# Patient Record
Sex: Female | Born: 1940 | Race: White | Hispanic: No | Smoking: Never smoker
Health system: Southern US, Community
[De-identification: ages and names within clinical notes are randomized; demographics above are authoritative.]

## PROBLEM LIST (undated history)

## (undated) DIAGNOSIS — I10 Essential (primary) hypertension: Secondary | ICD-10-CM

## (undated) DIAGNOSIS — H269 Unspecified cataract: Secondary | ICD-10-CM

## (undated) DIAGNOSIS — E7800 Pure hypercholesterolemia, unspecified: Secondary | ICD-10-CM

## (undated) DIAGNOSIS — F329 Major depressive disorder, single episode, unspecified: Secondary | ICD-10-CM

## (undated) DIAGNOSIS — C4490 Unspecified malignant neoplasm of skin, unspecified: Secondary | ICD-10-CM

## (undated) DIAGNOSIS — F32A Depression, unspecified: Secondary | ICD-10-CM

## (undated) DIAGNOSIS — J45909 Unspecified asthma, uncomplicated: Secondary | ICD-10-CM

## (undated) DIAGNOSIS — H409 Unspecified glaucoma: Secondary | ICD-10-CM

## (undated) HISTORY — PX: SKIN CANCER EXCISION: SHX779

---

## 1998-11-24 ENCOUNTER — Other Ambulatory Visit: Admission: RE | Admit: 1998-11-24 | Discharge: 1998-11-24 | Payer: Self-pay | Admitting: *Deleted

## 2000-04-10 ENCOUNTER — Emergency Department (HOSPITAL_COMMUNITY): Admission: EM | Admit: 2000-04-10 | Discharge: 2000-04-10 | Payer: Self-pay | Admitting: Emergency Medicine

## 2000-04-10 ENCOUNTER — Encounter: Payer: Self-pay | Admitting: Emergency Medicine

## 2002-06-22 ENCOUNTER — Encounter: Payer: Self-pay | Admitting: Family Medicine

## 2002-06-22 ENCOUNTER — Ambulatory Visit (HOSPITAL_COMMUNITY): Admission: RE | Admit: 2002-06-22 | Discharge: 2002-06-22 | Payer: Self-pay | Admitting: Family Medicine

## 2004-03-31 ENCOUNTER — Ambulatory Visit (HOSPITAL_COMMUNITY): Admission: RE | Admit: 2004-03-31 | Discharge: 2004-03-31 | Payer: Self-pay | Admitting: Family Medicine

## 2004-04-08 ENCOUNTER — Other Ambulatory Visit: Admission: RE | Admit: 2004-04-08 | Discharge: 2004-04-08 | Payer: Self-pay | Admitting: Family Medicine

## 2004-05-28 ENCOUNTER — Emergency Department (HOSPITAL_COMMUNITY): Admission: EM | Admit: 2004-05-28 | Discharge: 2004-05-28 | Payer: Self-pay | Admitting: Emergency Medicine

## 2006-04-12 ENCOUNTER — Other Ambulatory Visit: Admission: RE | Admit: 2006-04-12 | Discharge: 2006-04-12 | Payer: Self-pay | Admitting: Family Medicine

## 2008-04-10 ENCOUNTER — Other Ambulatory Visit: Admission: RE | Admit: 2008-04-10 | Discharge: 2008-04-10 | Payer: Self-pay | Admitting: Family Medicine

## 2008-05-13 ENCOUNTER — Ambulatory Visit: Payer: Self-pay | Admitting: Vascular Surgery

## 2010-06-16 NOTE — Procedures (Signed)
LOWER EXTREMITY VENOUS REFLUX EXAM   INDICATION:  Bilateral leg varicose veins with pain.   EXAM:  Using color-flow imaging and pulse Doppler spectral analysis, the  right and left common femoral, superficial femoral, popliteal, posterior  tibial, greater and lesser saphenous veins are evaluated.  There is no  evidence suggesting deep venous insufficiency in the right and left  lower extremity.   The right and left saphenofemoral junction is competent.  The right and  left GSV is competent with the caliber as described below.   The right and left proximal short saphenous vein demonstrates  competency.   GSV Diameter (used if found to be incompetent only)                                            Right    Left  Proximal Greater Saphenous Vein           cm       cm  Proximal-to-mid-thigh                     cm       cm  Mid thigh                                 cm       cm  Mid-distal thigh                          cm       cm  Distal thigh                              cm       cm  Knee                                      cm       cm   IMPRESSION:  1. The right and left greater saphenous vein reflux showed no evidence      of reflux.  2. The right and left greater saphenous vein is not aneurysmal.  3. The right and left greater saphenous vein is not tortuous.  4. The deep venous system is competent.  5. The right and left lesser saphenous veins are competent.  6. No evidence of deep venous thrombosis noted in the leg.   ___________________________________________  Quita Skye. Hart Rochester, M.D.   MG/MEDQ  D:  05/13/2008  T:  05/13/2008  Job:  [id number]

## 2010-06-16 NOTE — Consult Note (Signed)
NEW PATIENT CONSULTATION   Isabel Preston, Isabel Preston  DOB:  October 04, 1940                                       05/13/2008  VFIEP#:[id number]   The patient is a 70 year old female referred for venous insufficiency of  both lower extremities.  She has been having aching, throbbing  discomfort in both thigh and posterior calf areas for the last 2 years  which has worsened.  She has noticed some worsening patterns of venous  insufficiency in these areas.  She has no history of deep vein  thrombosis, thrombophlebitis, pulmonary emboli or clotting problems.  She has had no bleeding, stasis ulcers or distal edema.  The discomfort  is equal on the right and left sides.  She does take Advil, which helps  the symptoms, and it is improved by walking.   PAST MEDICAL HISTORY:  1. Hypertension.  2. Negative for diabetes, coronary artery disease, hyperlipidemia,      COPD or stroke.   PAST SURGICAL HISTORY:  None.   FAMILY HISTORY:  Positive for congestive heart failure in her mother,  diabetes in a sister, negative for stroke.   SOCIAL HISTORY:  She is married, works as a housewife, does not use  tobacco or alcohol.   REVIEW OF SYSTEMS:  Unremarkable with the exception of the lower  extremity discomfort.  Denies any chest pain, dyspnea on exertion, PND,  orthopnea, anorexia, weight loss, etc.   ALLERGIES:  Sulfa and iodine.   MEDICATIONS:  Please see health history form.   PHYSICAL EXAMINATION:  Blood pressure 150/70, heart rate 70,  respirations 14.  General:  She is a healthy-appearing middle-aged  female in no apparent distress, alert and oriented x3.  Neck:  Supple,  3+ carotid pulses palpable.  No bruits are audible.  Neurologic:  Normal.  No palpable adenopathy in the neck.  Chest:  Clear to  auscultation.  Cardiovascular:  Regular rhythm, no murmurs.  Abdomen:  Soft, nontender with no masses.  She has 3+ femoral, popliteal and 2+  dorsalis pedis pulses bilaterally.   There is no hyperpigmentation,  ulceration or evidence of large varicosities.  She has multiple patches  of spider and reticular veins, particularly in the right pretibial area  laterally between the knee and ankle.  On the left leg she has extensive  spider veins along the lateral thigh extending over to the medial thigh.  There is no ulceration involving these.   Venous duplex exam reveals normal deep system and no reflux in the  superficial system including the small and great saphenous veins.   I reassured her that her saphenous systems were okay.  If she desires  treatment, I think the best treatment would be primary sclerotherapy for  these patches of spider veins and reticular veins.  She will consider  this and if she would like to proceed she will be in touch with [place].  I  did tell her that we could not be certain that is causing her discomfort  and that following treatment with sclerotherapy her symptoms may or may  not be relieved.   Quita Skye Hart Rochester, M.D.  Electronically Signed   JDL/MEDQ  D:  05/13/2008  T:  05/14/2008  Job:  [id number]   cc:   Katrina Stack, Dr.

## 2011-09-02 ENCOUNTER — Ambulatory Visit (HOSPITAL_BASED_OUTPATIENT_CLINIC_OR_DEPARTMENT_OTHER)
Admission: RE | Admit: 2011-09-02 | Discharge: 2011-09-02 | Disposition: A | Discharge status: Home / Self Care | Payer: Medicare Other | Source: Ambulatory Visit | Attending: Family Medicine | Admitting: Family Medicine

## 2011-09-02 ENCOUNTER — Other Ambulatory Visit (HOSPITAL_BASED_OUTPATIENT_CLINIC_OR_DEPARTMENT_OTHER): Payer: Self-pay | Admitting: Family Medicine

## 2011-09-02 DIAGNOSIS — M5137 Other intervertebral disc degeneration, lumbosacral region: Secondary | ICD-10-CM | POA: Insufficient documentation

## 2011-09-02 DIAGNOSIS — M51379 Other intervertebral disc degeneration, lumbosacral region without mention of lumbar back pain or lower extremity pain: Secondary | ICD-10-CM | POA: Insufficient documentation

## 2011-09-02 DIAGNOSIS — R109 Unspecified abdominal pain: Secondary | ICD-10-CM

## 2011-09-02 DIAGNOSIS — J9819 Other pulmonary collapse: Secondary | ICD-10-CM | POA: Insufficient documentation

## 2011-09-02 DIAGNOSIS — K449 Diaphragmatic hernia without obstruction or gangrene: Secondary | ICD-10-CM | POA: Insufficient documentation

## 2011-09-09 ENCOUNTER — Other Ambulatory Visit (HOSPITAL_BASED_OUTPATIENT_CLINIC_OR_DEPARTMENT_OTHER): Payer: Self-pay | Admitting: Family Medicine

## 2011-09-09 DIAGNOSIS — R599 Enlarged lymph nodes, unspecified: Secondary | ICD-10-CM

## 2011-09-13 ENCOUNTER — Other Ambulatory Visit (HOSPITAL_BASED_OUTPATIENT_CLINIC_OR_DEPARTMENT_OTHER): Payer: Medicare Other

## 2011-09-15 ENCOUNTER — Ambulatory Visit (HOSPITAL_BASED_OUTPATIENT_CLINIC_OR_DEPARTMENT_OTHER)
Admission: RE | Admit: 2011-09-15 | Discharge: 2011-09-15 | Disposition: A | Discharge status: Home / Self Care | Payer: Medicare Other | Source: Ambulatory Visit | Attending: Family Medicine | Admitting: Family Medicine

## 2011-09-15 DIAGNOSIS — R918 Other nonspecific abnormal finding of lung field: Secondary | ICD-10-CM | POA: Insufficient documentation

## 2011-09-15 DIAGNOSIS — R599 Enlarged lymph nodes, unspecified: Secondary | ICD-10-CM

## 2011-09-20 ENCOUNTER — Other Ambulatory Visit: Payer: Self-pay | Admitting: Gastroenterology

## 2011-09-20 ENCOUNTER — Other Ambulatory Visit (HOSPITAL_COMMUNITY)
Admission: RE | Admit: 2011-09-20 | Discharge: 2011-09-20 | Disposition: A | Discharge status: Home / Self Care | Payer: Medicare Other | Source: Ambulatory Visit | Attending: Gastroenterology | Admitting: Gastroenterology

## 2011-09-20 DIAGNOSIS — B379 Candidiasis, unspecified: Secondary | ICD-10-CM | POA: Insufficient documentation

## 2012-06-14 ENCOUNTER — Other Ambulatory Visit (HOSPITAL_COMMUNITY): Payer: Self-pay | Admitting: Family Medicine

## 2012-06-14 DIAGNOSIS — Z780 Asymptomatic menopausal state: Secondary | ICD-10-CM

## 2012-06-14 DIAGNOSIS — Z1231 Encounter for screening mammogram for malignant neoplasm of breast: Secondary | ICD-10-CM

## 2012-06-28 ENCOUNTER — Ambulatory Visit (HOSPITAL_COMMUNITY): Payer: Medicare Other

## 2012-06-30 ENCOUNTER — Ambulatory Visit (HOSPITAL_COMMUNITY)
Admission: RE | Admit: 2012-06-30 | Discharge: 2012-06-30 | Disposition: A | Discharge status: Home / Self Care | Payer: Medicare Other | Source: Ambulatory Visit | Attending: Family Medicine | Admitting: Family Medicine

## 2012-06-30 DIAGNOSIS — Z1231 Encounter for screening mammogram for malignant neoplasm of breast: Secondary | ICD-10-CM

## 2012-06-30 DIAGNOSIS — Z780 Asymptomatic menopausal state: Secondary | ICD-10-CM | POA: Insufficient documentation

## 2012-06-30 DIAGNOSIS — Z13820 Encounter for screening for osteoporosis: Secondary | ICD-10-CM | POA: Insufficient documentation

## 2013-05-24 ENCOUNTER — Other Ambulatory Visit: Payer: Self-pay | Admitting: Family Medicine

## 2013-05-24 DIAGNOSIS — R1013 Epigastric pain: Secondary | ICD-10-CM

## 2013-05-31 ENCOUNTER — Other Ambulatory Visit: Payer: Medicare Other

## 2014-04-05 ENCOUNTER — Other Ambulatory Visit (HOSPITAL_COMMUNITY): Payer: Self-pay | Admitting: Family Medicine

## 2014-04-05 DIAGNOSIS — Z1231 Encounter for screening mammogram for malignant neoplasm of breast: Secondary | ICD-10-CM

## 2014-04-30 ENCOUNTER — Ambulatory Visit (HOSPITAL_COMMUNITY): Payer: Self-pay

## 2014-05-01 ENCOUNTER — Ambulatory Visit (HOSPITAL_COMMUNITY): Payer: Self-pay

## 2014-05-16 ENCOUNTER — Ambulatory Visit (HOSPITAL_COMMUNITY): Payer: Self-pay

## 2014-07-19 NOTE — Patient Instructions (Signed)
Isabel Preston  07/19/2014        Your procedure is scheduled on 8:20  Report to [facility] at 8:20 A.M.  Call this number if you have problems the morning of surgery:  [phone]   Remember:  Do not eat food or drink liquids after midnight.  Take these medicines the morning of surgery with A SIP OF WATER Advair and Singulair   Do not wear jewelry, make-up or nail polish.  Do not wear lotions, powders, or perfumes.  You may wear deodorant.  Do not shave 48 hours prior to surgery.  Men may shave face and neck.  Do not bring valuables to the hospital.  [facility] is not responsible for any belongings or valuables.  Contacts, dentures or bridgework may not be worn into surgery.  Leave your suitcase in the car.  After surgery it may be brought to your room.  For patients admitted to the hospital, discharge time will be determined by your treatment team.  Patients discharged the day of surgery will not be allowed to drive home.    Please read over the following fact sheets that you were given. Anesthesia Post-op Instructions      PATIENT INSTRUCTIONS POST-ANESTHESIA  IMMEDIATELY FOLLOWING SURGERY:  Do not drive or operate machinery for the first twenty four hours after surgery.  Do not make any important decisions for twenty four hours after surgery or while taking narcotic pain medications or sedatives.  If you develop intractable nausea and vomiting or a severe headache please notify your doctor immediately.  FOLLOW-UP:  Please make an appointment with your surgeon as instructed. You do not need to follow up with anesthesia unless specifically instructed to do so.  WOUND CARE INSTRUCTIONS (if applicable):  Keep a dry clean dressing on the anesthesia/puncture wound site if there is drainage.  Once the wound has quit draining you may leave it open to air.  Generally you should leave the bandage intact for twenty four hours unless there is drainage.  If the  epidural site drains for more than 36-48 hours please call the anesthesia department.  QUESTIONS?:  Please feel free to call your physician or the hospital operator if you have any questions, and they will be happy to assist you.      Cataract A cataract is a clouding of the lens of the eye. When a lens becomes cloudy, vision is reduced based on the degree and nature of the clouding. Many cataracts reduce vision to some degree. Some cataracts make people more near-sighted as they develop. Other cataracts increase glare. Cataracts that are ignored and become worse can sometimes look white. The white color can be seen through the pupil. CAUSES   Aging. However, cataracts may occur at any age, even in newborns.  Certain drugs.  Trauma to the eye.  Certain diseases such as diabetes.  Specific eye diseases such as chronic inflammation inside the eye or a sudden attack of a rare form of glaucoma.  Inherited or acquired medical problems. SYMPTOMS   Gradual, progressive drop in vision in the affected eye.  Severe, rapid visual loss. This most often happens when trauma is the cause. DIAGNOSIS  To detect a cataract, an eye doctor examines the lens. Cataracts are best diagnosed with an exam of the eyes with the pupils enlarged (dilated) by drops.  TREATMENT  For an early cataract, vision may improve by using different eyeglasses or stronger lighting. If that does not help your  vision, surgery is the only effective treatment. A cataract needs to be surgically removed when vision loss interferes with your everyday activities, such as driving, reading, or watching TV. A cataract may also have to be removed if it prevents examination or treatment of another eye problem. Surgery removes the cloudy lens and usually replaces it with a substitute lens (intraocular lens, IOL).  At a time when both you and your doctor agree, the cataract will be surgically removed. If you have cataracts in both eyes, only  one is usually removed at a time. This allows the operated eye to heal and be out of danger from any possible problems after surgery (such as infection or poor wound healing). In rare cases, a cataract may be doing damage to your eye. In these cases, your caregiver may advise surgical removal right away. The vast majority of people who have cataract surgery have better vision afterward. HOME CARE INSTRUCTIONS  If you are not planning surgery, you may be asked to do the following:  Use different eyeglasses.  Use stronger or brighter lighting.  Ask your eye doctor about reducing your medicine dose or changing medicines if it is thought that a medicine caused your cataract. Changing medicines does not make the cataract go away on its own.  Become familiar with your surroundings. Poor vision can lead to injury. Avoid bumping into things on the affected side. You are at a higher risk for tripping or falling.  Exercise extreme care when driving or operating machinery.  Wear sunglasses if you are sensitive to bright light or experiencing problems with glare. SEEK IMMEDIATE MEDICAL CARE IF:   You have a worsening or sudden vision loss.  You notice redness, swelling, or increasing pain in the eye.  You have a fever. Document Released: 01/18/2005 Document Revised: 04/12/2011 Document Reviewed: 09/11/2010 [facility] Patient Information 2015 [place], [place]. This information is not intended to replace advice given to you by your health care provider. Make sure you discuss any questions you have with your health care provider.

## 2014-07-22 ENCOUNTER — Other Ambulatory Visit: Payer: Self-pay

## 2014-07-22 ENCOUNTER — Encounter (HOSPITAL_COMMUNITY): Payer: Self-pay

## 2014-07-22 ENCOUNTER — Encounter (HOSPITAL_COMMUNITY)
Admission: RE | Admit: 2014-07-22 | Discharge: 2014-07-22 | Disposition: A | Discharge status: Home / Self Care | Payer: Medicare Other | Source: Ambulatory Visit | Attending: Ophthalmology | Admitting: Ophthalmology

## 2014-07-22 DIAGNOSIS — H269 Unspecified cataract: Secondary | ICD-10-CM | POA: Diagnosis present

## 2014-07-22 DIAGNOSIS — J45909 Unspecified asthma, uncomplicated: Secondary | ICD-10-CM | POA: Diagnosis not present

## 2014-07-22 DIAGNOSIS — Z85828 Personal history of other malignant neoplasm of skin: Secondary | ICD-10-CM | POA: Diagnosis not present

## 2014-07-22 DIAGNOSIS — H2512 Age-related nuclear cataract, left eye: Secondary | ICD-10-CM | POA: Diagnosis not present

## 2014-07-22 DIAGNOSIS — I10 Essential (primary) hypertension: Secondary | ICD-10-CM | POA: Diagnosis not present

## 2014-07-22 DIAGNOSIS — Z79899 Other long term (current) drug therapy: Secondary | ICD-10-CM | POA: Diagnosis not present

## 2014-07-22 HISTORY — DX: Unspecified malignant neoplasm of skin, unspecified: C44.90

## 2014-07-22 HISTORY — DX: Unspecified glaucoma: H40.9

## 2014-07-22 HISTORY — DX: Unspecified cataract: H26.9

## 2014-07-22 HISTORY — DX: Major depressive disorder, single episode, unspecified: F32.9

## 2014-07-22 HISTORY — DX: Unspecified asthma, uncomplicated: J45.909

## 2014-07-22 HISTORY — DX: Essential (primary) hypertension: I10

## 2014-07-22 HISTORY — DX: Pure hypercholesterolemia, unspecified: E78.00

## 2014-07-22 HISTORY — DX: Depression, unspecified: F32.A

## 2014-07-22 LAB — BASIC METABOLIC PANEL
Anion gap: 6 (ref 5–15)
BUN: 13 mg/dL (ref 6–20)
CALCIUM: 9.3 mg/dL (ref 8.9–10.3)
CO2: 31 mmol/L (ref 22–32)
CREATININE: 0.77 mg/dL (ref 0.44–1.00)
Chloride: 90 mmol/L — ABNORMAL LOW (ref 101–111)
Glucose, Bld: 98 mg/dL (ref 65–99)
POTASSIUM: 4 mmol/L (ref 3.5–5.1)
Sodium: 127 mmol/L — ABNORMAL LOW (ref 135–145)

## 2014-07-22 LAB — CBC
HEMATOCRIT: 33 % — AB (ref 36.0–46.0)
HEMOGLOBIN: 11 g/dL — AB (ref 12.0–15.0)
MCH: 29.6 pg (ref 26.0–34.0)
MCHC: 33.3 g/dL (ref 30.0–36.0)
MCV: 88.9 fL (ref 78.0–100.0)
Platelets: 371 10*3/uL (ref 150–400)
RBC: 3.71 MIL/uL — AB (ref 3.87–5.11)
RDW: 13.9 % (ref 11.5–15.5)
WBC: 8.8 10*3/uL (ref 4.0–10.5)

## 2014-07-24 MED ORDER — PHENYLEPHRINE HCL 2.5 % OP SOLN
OPHTHALMIC | Status: AC
  Filled 2014-07-24: qty 15

## 2014-07-24 MED ORDER — TETRACAINE HCL 0.5 % OP SOLN
OPHTHALMIC | Status: AC
  Filled 2014-07-24: qty 2

## 2014-07-24 MED ORDER — LIDOCAINE HCL (PF) 1 % IJ SOLN
INTRAMUSCULAR | Status: AC
  Filled 2014-07-24: qty 2

## 2014-07-24 MED ORDER — LIDOCAINE HCL 3.5 % OP GEL
OPHTHALMIC | Status: AC
  Filled 2014-07-24: qty 1

## 2014-07-24 MED ORDER — NEOMYCIN-POLYMYXIN-DEXAMETH 3.5-10000-0.1 OP SUSP
OPHTHALMIC | Status: AC
  Filled 2014-07-24: qty 5

## 2014-07-24 MED ORDER — CYCLOPENTOLATE-PHENYLEPHRINE OP SOLN OPTIME - NO CHARGE
OPHTHALMIC | Status: AC
  Filled 2014-07-24: qty 2

## 2014-07-25 ENCOUNTER — Ambulatory Visit (HOSPITAL_COMMUNITY): Payer: Medicare Other | Admitting: Anesthesiology

## 2014-07-25 ENCOUNTER — Encounter (HOSPITAL_COMMUNITY)
Admission: RE | Disposition: A | Discharge status: Home / Self Care | Payer: Self-pay | Source: Ambulatory Visit | Attending: Ophthalmology

## 2014-07-25 ENCOUNTER — Ambulatory Visit (HOSPITAL_COMMUNITY)
Admission: RE | Admit: 2014-07-25 | Discharge: 2014-07-25 | Disposition: A | Discharge status: Home / Self Care | Payer: Medicare Other | Source: Ambulatory Visit | Attending: Ophthalmology | Admitting: Ophthalmology

## 2014-07-25 ENCOUNTER — Encounter (HOSPITAL_COMMUNITY): Payer: Self-pay | Admitting: *Deleted

## 2014-07-25 DIAGNOSIS — J45909 Unspecified asthma, uncomplicated: Secondary | ICD-10-CM | POA: Insufficient documentation

## 2014-07-25 DIAGNOSIS — I10 Essential (primary) hypertension: Secondary | ICD-10-CM | POA: Diagnosis not present

## 2014-07-25 DIAGNOSIS — Z85828 Personal history of other malignant neoplasm of skin: Secondary | ICD-10-CM | POA: Insufficient documentation

## 2014-07-25 DIAGNOSIS — Z79899 Other long term (current) drug therapy: Secondary | ICD-10-CM | POA: Insufficient documentation

## 2014-07-25 DIAGNOSIS — H2512 Age-related nuclear cataract, left eye: Secondary | ICD-10-CM | POA: Diagnosis not present

## 2014-07-25 HISTORY — PX: CATARACT EXTRACTION W/PHACO: SHX586

## 2014-07-25 SURGERY — PHACOEMULSIFICATION, CATARACT, WITH IOL INSERTION
Anesthesia: Monitor Anesthesia Care | Site: Eye | Laterality: Left

## 2014-07-25 MED ORDER — BSS IO SOLN
INTRAOCULAR | Status: DC | PRN
  Administered 2014-07-25: 15 mL via INTRAOCULAR

## 2014-07-25 MED ORDER — CYCLOPENTOLATE-PHENYLEPHRINE 0.2-1 % OP SOLN
1.0000 [drp] | OPHTHALMIC | Status: AC
  Administered 2014-07-25 (×3): 1 [drp] via OPHTHALMIC

## 2014-07-25 MED ORDER — FENTANYL CITRATE (PF) 100 MCG/2ML IJ SOLN
25.0000 ug | INTRAMUSCULAR | Status: AC
  Administered 2014-07-25 (×2): 25 ug via INTRAVENOUS

## 2014-07-25 MED ORDER — LIDOCAINE 3.5 % OP GEL OPTIME - NO CHARGE
OPHTHALMIC | Status: DC | PRN
  Administered 2014-07-25: 1 [drp] via OPHTHALMIC

## 2014-07-25 MED ORDER — LACTATED RINGERS IV SOLN
INTRAVENOUS | Status: DC
  Administered 2014-07-25: 1000 mL via INTRAVENOUS

## 2014-07-25 MED ORDER — MIDAZOLAM HCL 2 MG/2ML IJ SOLN
INTRAMUSCULAR | Status: AC
  Filled 2014-07-25: qty 2

## 2014-07-25 MED ORDER — FENTANYL CITRATE (PF) 100 MCG/2ML IJ SOLN: 25.0000 ug | INTRAMUSCULAR | Status: DC | PRN

## 2014-07-25 MED ORDER — ONDANSETRON HCL 4 MG/2ML IJ SOLN: 4.0000 mg | Freq: Once | INTRAMUSCULAR | Status: DC | PRN

## 2014-07-25 MED ORDER — LIDOCAINE HCL 3.5 % OP GEL
1.0000 "application " | Freq: Once | OPHTHALMIC | Status: AC
  Administered 2014-07-25: 1 via OPHTHALMIC

## 2014-07-25 MED ORDER — NEOMYCIN-POLYMYXIN-DEXAMETH 3.5-10000-0.1 OP SUSP
OPHTHALMIC | Status: DC | PRN
  Administered 2014-07-25: 1 [drp] via OPHTHALMIC

## 2014-07-25 MED ORDER — PHENYLEPHRINE HCL 2.5 % OP SOLN
1.0000 [drp] | OPHTHALMIC | Status: AC
  Administered 2014-07-25 (×3): 1 [drp] via OPHTHALMIC

## 2014-07-25 MED ORDER — PROVISC 10 MG/ML IO SOLN
INTRAOCULAR | Status: DC | PRN
  Administered 2014-07-25: 0.85 mL via INTRAOCULAR

## 2014-07-25 MED ORDER — LIDOCAINE HCL (PF) 1 % IJ SOLN
INTRAMUSCULAR | Status: DC | PRN
  Administered 2014-07-25: .6 mL

## 2014-07-25 MED ORDER — TETRACAINE HCL 0.5 % OP SOLN
1.0000 [drp] | OPHTHALMIC | Status: AC
  Administered 2014-07-25 (×3): 1 [drp] via OPHTHALMIC

## 2014-07-25 MED ORDER — POVIDONE-IODINE 5 % OP SOLN
OPHTHALMIC | Status: DC | PRN
  Administered 2014-07-25: 1 via OPHTHALMIC

## 2014-07-25 MED ORDER — MIDAZOLAM HCL 2 MG/2ML IJ SOLN
1.0000 mg | INTRAMUSCULAR | Status: DC | PRN
  Administered 2014-07-25 (×2): 2 mg via INTRAVENOUS

## 2014-07-25 MED ORDER — EPINEPHRINE HCL 1 MG/ML IJ SOLN
INTRAOCULAR | Status: DC | PRN
  Administered 2014-07-25: 500 mL

## 2014-07-25 MED ORDER — FENTANYL CITRATE (PF) 100 MCG/2ML IJ SOLN
INTRAMUSCULAR | Status: AC
  Filled 2014-07-25: qty 2

## 2014-07-25 MED ORDER — MIDAZOLAM HCL 5 MG/5ML IJ SOLN
INTRAMUSCULAR | Status: AC
  Filled 2014-07-25: qty 5

## 2014-07-25 SURGICAL SUPPLY — 34 items
CAPSULAR TENSION RING-AMO (OPHTHALMIC RELATED) IMPLANT
CLOTH BEACON ORANGE TIMEOUT ST (SAFETY) ×2 IMPLANT
EYE SHIELD UNIVERSAL CLEAR (GAUZE/BANDAGES/DRESSINGS) ×2 IMPLANT
GLOVE BIO SURGEON STRL SZ 6.5 (GLOVE) IMPLANT
GLOVE BIO SURGEONS STRL SZ 6.5 (GLOVE)
GLOVE BIOGEL PI IND STRL 6.5 (GLOVE) IMPLANT
GLOVE BIOGEL PI IND STRL 7.0 (GLOVE) IMPLANT
GLOVE BIOGEL PI IND STRL 7.5 (GLOVE) IMPLANT
GLOVE BIOGEL PI INDICATOR 6.5 (GLOVE) ×2
GLOVE BIOGEL PI INDICATOR 7.0 (GLOVE)
GLOVE BIOGEL PI INDICATOR 7.5 (GLOVE)
GLOVE ECLIPSE 6.5 STRL STRAW (GLOVE) IMPLANT
GLOVE ECLIPSE 7.0 STRL STRAW (GLOVE) IMPLANT
GLOVE ECLIPSE 7.5 STRL STRAW (GLOVE) IMPLANT
GLOVE EXAM NITRILE LRG STRL (GLOVE) IMPLANT
GLOVE EXAM NITRILE MD LF STRL (GLOVE) ×2 IMPLANT
GLOVE SKINSENSE NS SZ6.5 (GLOVE)
GLOVE SKINSENSE NS SZ7.0 (GLOVE)
GLOVE SKINSENSE STRL SZ6.5 (GLOVE) IMPLANT
GLOVE SKINSENSE STRL SZ7.0 (GLOVE) IMPLANT
KIT VITRECTOMY (OPHTHALMIC RELATED) IMPLANT
PAD ARMBOARD 7.5X6 YLW CONV (MISCELLANEOUS) IMPLANT
PROC W NO LENS (INTRAOCULAR LENS)
PROC W SPEC LENS (INTRAOCULAR LENS)
PROCESS W NO LENS (INTRAOCULAR LENS) IMPLANT
PROCESS W SPEC LENS (INTRAOCULAR LENS) IMPLANT
RETRACTOR IRIS SIGHTPATH (OPHTHALMIC RELATED) IMPLANT
RING MALYGIN (MISCELLANEOUS) IMPLANT
SIGHTPATH CAT PROC W REG LENS (Ophthalmic Related) ×3 IMPLANT
SYRINGE LUER LOK 1CC (MISCELLANEOUS) ×2 IMPLANT
TAPE SURG TRANSPORE 1 IN (GAUZE/BANDAGES/DRESSINGS) IMPLANT
TAPE SURGICAL TRANSPORE 1 IN (GAUZE/BANDAGES/DRESSINGS) ×2
VISCOELASTIC ADDITIONAL (OPHTHALMIC RELATED) IMPLANT
WATER STERILE IRR 250ML POUR (IV SOLUTION) ×2 IMPLANT

## 2014-07-25 NOTE — Op Note (Signed)
Date of Admission: 07/25/2014  Date of Surgery: 07/25/2014   Pre-Op Dx: Cataract Left Eye  Post-Op Dx: Senile Nuclear Cataract Left  Eye,  Dx Code H25.12  Surgeon: Tonny Branch, M.D.  Assistants: None  Anesthesia: Topical with MAC  Indications: Painless, progressive loss of vision with compromise of daily activities.  Surgery: Cataract Extraction with Intraocular lens Implant Left Eye  Discription: The patient had dilating drops and viscous lidocaine placed into the Left eye in the pre-op holding area. After transfer to the operating room, a time out was performed. The patient was then prepped and draped. Beginning with a 68 degree blade a paracentesis port was made at the surgeon's 2 o'clock position. The anterior chamber was then filled with 1% non-preserved lidocaine. This was followed by filling the anterior chamber with Provisc.  A 2.31mm keratome blade was used to make a clear corneal incision at the temporal limbus.  A bent cystatome needle was used to create a continuous tear capsulotomy. Hydrodissection was performed with balanced salt solution on a Fine canula. The lens nucleus was then removed using the phacoemulsification handpiece. Residual cortex was removed with the I&A handpiece. The anterior chamber and capsular bag were refilled with Provisc. A posterior chamber intraocular lens was placed into the capsular bag with it's injector. The implant was positioned with the Kuglan hook. The Provisc was then removed from the anterior chamber and capsular bag with the I&A handpiece. Stromal hydration of the main incision and paracentesis port was performed with BSS on a Fine canula. The wounds were tested for leak which was negative. The patient tolerated the procedure well. There were no operative complications. The patient was then transferred to the recovery room in stable condition.  Complications: None  Specimen: None  EBL: None  Prosthetic device: Hoya iSert 250, power 21.5 D, SN  [id number].

## 2014-07-25 NOTE — Anesthesia Postprocedure Evaluation (Signed)
  Anesthesia Post-op Note  Patient: Isabel Preston  Procedure(s) Performed: Procedure(s) (LRB): CATARACT EXTRACTION PHACO AND INTRAOCULAR LENS PLACEMENT ; CDE:  5.83 (Left)  Patient Location:  Short Stay  Anesthesia Type: MAC  Level of Consciousness: awake  Airway and Oxygen Therapy: Patient Spontanous Breathing  Post-op Pain: none  Post-op Assessment: Post-op Vital signs reviewed, Patient's Cardiovascular Status Stable, Respiratory Function Stable, Patent Airway, No signs of Nausea or vomiting and Pain level controlled  Post-op Vital Signs: Reviewed and stable  Complications: No apparent anesthesia complications

## 2014-07-25 NOTE — Anesthesia Procedure Notes (Signed)
Procedure Name: MAC Date/Time: 07/25/2014 12:13 PM Performed by: Vista Deck Pre-anesthesia Checklist: Patient identified, Emergency Drugs available, Suction available, Timeout performed and Patient being monitored Patient Re-evaluated:Patient Re-evaluated prior to inductionOxygen Delivery Method: Nasal Cannula

## 2014-07-25 NOTE — Transfer of Care (Signed)
Immediate Anesthesia Transfer of Care Note  Patient: Isabel Preston  Procedure(s) Performed: Procedure(s) (LRB): CATARACT EXTRACTION PHACO AND INTRAOCULAR LENS PLACEMENT ; CDE:  5.83 (Left)  Patient Location: Shortstay  Anesthesia Type: MAC  Level of Consciousness: awake  Airway & Oxygen Therapy: Patient Spontanous Breathing   Post-op Assessment: Report given to PACU RN, Post -op Vital signs reviewed and stable and Patient moving all extremities  Post vital signs: Reviewed and stable  Complications: No apparent anesthesia complications

## 2014-07-25 NOTE — H&P (Signed)
I have reviewed the H&P, the patient was re-examined, and I have identified no interval changes in medical condition and plan of care since the history and physical of record  

## 2014-07-25 NOTE — Anesthesia Preprocedure Evaluation (Signed)
Anesthesia Evaluation  Patient identified by MRN, date of birth, ID band Patient awake    Reviewed: Allergy & Precautions, NPO status , Patient's Chart, lab work & pertinent test results  Airway Mallampati: II  TM Distance: >3 FB     Dental  (+) Edentulous Upper, Edentulous Lower   Pulmonary asthma ,  breath sounds clear to auscultation        Cardiovascular hypertension, Pt. on medications Rhythm:Regular Rate:Normal     Neuro/Psych PSYCHIATRIC DISORDERS Depression    GI/Hepatic negative GI ROS,   Endo/Other    Renal/GU      Musculoskeletal   Abdominal   Peds  Hematology   Anesthesia Other Findings   Reproductive/Obstetrics                             Anesthesia Physical Anesthesia Plan  ASA: II  Anesthesia Plan: MAC   Post-op Pain Management:    Induction: Intravenous  Airway Management Planned: Nasal Cannula  Additional Equipment:   Intra-op Plan:   Post-operative Plan:   Informed Consent: I have reviewed the patients History and Physical, chart, labs and discussed the procedure including the risks, benefits and alternatives for the proposed anesthesia with the patient or authorized representative who has indicated his/her understanding and acceptance.     Plan Discussed with:   Anesthesia Plan Comments:         Anesthesia Quick Evaluation

## 2014-07-25 NOTE — Discharge Instructions (Signed)

## 2014-07-29 ENCOUNTER — Encounter (HOSPITAL_COMMUNITY): Payer: Self-pay | Admitting: Ophthalmology

## 2014-08-07 ENCOUNTER — Encounter (HOSPITAL_COMMUNITY): Payer: Self-pay

## 2014-08-07 ENCOUNTER — Encounter (HOSPITAL_COMMUNITY)
Admission: RE | Admit: 2014-08-07 | Discharge: 2014-08-07 | Disposition: A | Discharge status: Home / Self Care | Payer: Medicare Other | Source: Ambulatory Visit | Attending: Ophthalmology | Admitting: Ophthalmology

## 2014-08-09 MED ORDER — PHENYLEPHRINE HCL 2.5 % OP SOLN
OPHTHALMIC | Status: AC
  Filled 2014-08-09: qty 15

## 2014-08-09 MED ORDER — LIDOCAINE HCL (PF) 1 % IJ SOLN
INTRAMUSCULAR | Status: AC
  Filled 2014-08-09: qty 2

## 2014-08-09 MED ORDER — TETRACAINE HCL 0.5 % OP SOLN
OPHTHALMIC | Status: AC
  Filled 2014-08-09: qty 2

## 2014-08-09 MED ORDER — CYCLOPENTOLATE-PHENYLEPHRINE OP SOLN OPTIME - NO CHARGE
OPHTHALMIC | Status: AC
  Filled 2014-08-09: qty 2

## 2014-08-09 MED ORDER — LIDOCAINE HCL 3.5 % OP GEL
OPHTHALMIC | Status: AC
  Filled 2014-08-09: qty 1

## 2014-08-09 MED ORDER — NEOMYCIN-POLYMYXIN-DEXAMETH 3.5-10000-0.1 OP SUSP
OPHTHALMIC | Status: AC
  Filled 2014-08-09: qty 5

## 2014-08-12 ENCOUNTER — Encounter (HOSPITAL_COMMUNITY)
Admission: RE | Disposition: A | Discharge status: Home / Self Care | Payer: Self-pay | Source: Ambulatory Visit | Attending: Ophthalmology

## 2014-08-12 ENCOUNTER — Ambulatory Visit (HOSPITAL_COMMUNITY)
Admission: RE | Admit: 2014-08-12 | Discharge: 2014-08-12 | Disposition: A | Discharge status: Home / Self Care | Payer: Medicare Other | Source: Ambulatory Visit | Attending: Ophthalmology | Admitting: Ophthalmology

## 2014-08-12 ENCOUNTER — Encounter (HOSPITAL_COMMUNITY): Payer: Self-pay | Admitting: *Deleted

## 2014-08-12 ENCOUNTER — Ambulatory Visit (HOSPITAL_COMMUNITY): Payer: Medicare Other | Admitting: Anesthesiology

## 2014-08-12 DIAGNOSIS — J45909 Unspecified asthma, uncomplicated: Secondary | ICD-10-CM | POA: Insufficient documentation

## 2014-08-12 DIAGNOSIS — H2511 Age-related nuclear cataract, right eye: Secondary | ICD-10-CM | POA: Insufficient documentation

## 2014-08-12 DIAGNOSIS — F329 Major depressive disorder, single episode, unspecified: Secondary | ICD-10-CM | POA: Insufficient documentation

## 2014-08-12 DIAGNOSIS — Z888 Allergy status to other drugs, medicaments and biological substances status: Secondary | ICD-10-CM | POA: Diagnosis not present

## 2014-08-12 DIAGNOSIS — I10 Essential (primary) hypertension: Secondary | ICD-10-CM | POA: Insufficient documentation

## 2014-08-12 DIAGNOSIS — Z882 Allergy status to sulfonamides status: Secondary | ICD-10-CM | POA: Diagnosis not present

## 2014-08-12 DIAGNOSIS — Z885 Allergy status to narcotic agent status: Secondary | ICD-10-CM | POA: Insufficient documentation

## 2014-08-12 HISTORY — PX: CATARACT EXTRACTION W/PHACO: SHX586

## 2014-08-12 SURGERY — PHACOEMULSIFICATION, CATARACT, WITH IOL INSERTION
Anesthesia: Monitor Anesthesia Care | Site: Eye | Laterality: Right

## 2014-08-12 MED ORDER — LIDOCAINE HCL 3.5 % OP GEL
1.0000 "application " | Freq: Once | OPHTHALMIC | Status: AC
  Administered 2014-08-12: 1 via OPHTHALMIC

## 2014-08-12 MED ORDER — CYCLOPENTOLATE-PHENYLEPHRINE 0.2-1 % OP SOLN
1.0000 [drp] | OPHTHALMIC | Status: AC
Start: 2014-08-12 — End: 2014-08-12
  Administered 2014-08-12 (×3): 1 [drp] via OPHTHALMIC

## 2014-08-12 MED ORDER — POVIDONE-IODINE 5 % OP SOLN
OPHTHALMIC | Status: DC | PRN
  Administered 2014-08-12: 1 via OPHTHALMIC

## 2014-08-12 MED ORDER — BSS IO SOLN
INTRAOCULAR | Status: DC | PRN
  Administered 2014-08-12: 15 mL

## 2014-08-12 MED ORDER — FENTANYL CITRATE (PF) 100 MCG/2ML IJ SOLN
25.0000 ug | INTRAMUSCULAR | Status: AC
  Administered 2014-08-12 (×2): 25 ug via INTRAVENOUS

## 2014-08-12 MED ORDER — EPINEPHRINE HCL 1 MG/ML IJ SOLN
INTRAOCULAR | Status: DC | PRN
  Administered 2014-08-12: 500 mL

## 2014-08-12 MED ORDER — MIDAZOLAM HCL 2 MG/2ML IJ SOLN
INTRAMUSCULAR | Status: AC
  Filled 2014-08-12: qty 2

## 2014-08-12 MED ORDER — LACTATED RINGERS IV SOLN
INTRAVENOUS | Status: DC
Start: 2014-08-12 — End: 2014-08-12
  Administered 2014-08-12: 10:00:00 via INTRAVENOUS

## 2014-08-12 MED ORDER — PROVISC 10 MG/ML IO SOLN
INTRAOCULAR | Status: DC | PRN
  Administered 2014-08-12: 0.85 mL via INTRAOCULAR

## 2014-08-12 MED ORDER — NEOMYCIN-POLYMYXIN-DEXAMETH 3.5-10000-0.1 OP SUSP
OPHTHALMIC | Status: DC | PRN
  Administered 2014-08-12: 2 [drp] via OPHTHALMIC

## 2014-08-12 MED ORDER — LIDOCAINE HCL (PF) 1 % IJ SOLN
INTRAMUSCULAR | Status: DC | PRN
  Administered 2014-08-12: .4 mL

## 2014-08-12 MED ORDER — TETRACAINE HCL 0.5 % OP SOLN
1.0000 [drp] | OPHTHALMIC | Status: AC
  Administered 2014-08-12 (×3): 1 [drp] via OPHTHALMIC

## 2014-08-12 MED ORDER — EPINEPHRINE HCL 1 MG/ML IJ SOLN
INTRAMUSCULAR | Status: AC
  Filled 2014-08-12: qty 1

## 2014-08-12 MED ORDER — FENTANYL CITRATE (PF) 100 MCG/2ML IJ SOLN
INTRAMUSCULAR | Status: AC
  Filled 2014-08-12: qty 2

## 2014-08-12 MED ORDER — PHENYLEPHRINE HCL 2.5 % OP SOLN
1.0000 [drp] | OPHTHALMIC | Status: AC
  Administered 2014-08-12 (×3): 1 [drp] via OPHTHALMIC

## 2014-08-12 MED ORDER — LIDOCAINE 3.5 % OP GEL OPTIME - NO CHARGE: OPHTHALMIC | Status: DC | PRN

## 2014-08-12 MED ORDER — MIDAZOLAM HCL 2 MG/2ML IJ SOLN
1.0000 mg | INTRAMUSCULAR | Status: DC | PRN
  Administered 2014-08-12: 2 mg via INTRAVENOUS

## 2014-08-12 SURGICAL SUPPLY — 10 items
CLOTH BEACON ORANGE TIMEOUT ST (SAFETY) ×2 IMPLANT
EYE SHIELD UNIVERSAL CLEAR (GAUZE/BANDAGES/DRESSINGS) ×2 IMPLANT
GLOVE BIOGEL PI IND STRL 7.0 (GLOVE) IMPLANT
GLOVE BIOGEL PI INDICATOR 7.0 (GLOVE) ×4
PAD ARMBOARD 7.5X6 YLW CONV (MISCELLANEOUS) ×2 IMPLANT
SIGHTPATH CAT PROC W REG LENS (Ophthalmic Related) ×3 IMPLANT
SYRINGE LUER LOK 1CC (MISCELLANEOUS) ×2 IMPLANT
TAPE SURG TRANSPORE 1 IN (GAUZE/BANDAGES/DRESSINGS) IMPLANT
TAPE SURGICAL TRANSPORE 1 IN (GAUZE/BANDAGES/DRESSINGS) ×2
WATER STERILE IRR 250ML POUR (IV SOLUTION) ×2 IMPLANT

## 2014-08-12 NOTE — Anesthesia Preprocedure Evaluation (Signed)
Anesthesia Evaluation  Patient identified by MRN, date of birth, ID band Patient awake    Reviewed: Allergy & Precautions, NPO status , Patient's Chart, lab work & pertinent test results  Airway Mallampati: II  TM Distance: >3 FB     Dental  (+) Edentulous Upper, Edentulous Lower   Pulmonary asthma ,  breath sounds clear to auscultation        Cardiovascular hypertension, Pt. on medications Rhythm:Regular Rate:Normal     Neuro/Psych PSYCHIATRIC DISORDERS Depression    GI/Hepatic negative GI ROS,   Endo/Other    Renal/GU      Musculoskeletal   Abdominal   Peds  Hematology   Anesthesia Other Findings   Reproductive/Obstetrics                             Anesthesia Physical Anesthesia Plan  ASA: II  Anesthesia Plan: MAC   Post-op Pain Management:    Induction: Intravenous  Airway Management Planned: Nasal Cannula  Additional Equipment:   Intra-op Plan:   Post-operative Plan:   Informed Consent: I have reviewed the patients History and Physical, chart, labs and discussed the procedure including the risks, benefits and alternatives for the proposed anesthesia with the patient or authorized representative who has indicated his/her understanding and acceptance.     Plan Discussed with:   Anesthesia Plan Comments:         Anesthesia Quick Evaluation

## 2014-08-12 NOTE — Progress Notes (Signed)
headache

## 2014-08-12 NOTE — Transfer of Care (Signed)
Immediate Anesthesia Transfer of Care Note  Patient: Isabel Preston  Procedure(s) Performed: Procedure(s) with comments: CATARACT EXTRACTION PHACO AND INTRAOCULAR LENS PLACEMENT RIGHT EYE (Right) - CDE:6.52  Patient Location: Short Stay  Anesthesia Type:MAC  Level of Consciousness: awake  Airway & Oxygen Therapy: Patient Spontanous Breathing  Post-op Assessment: Report given to RN  Post vital signs: Reviewed  Last Vitals:  Filed Vitals:   08/12/14 1040  BP: 146/72  Pulse:   Temp:   Resp: 16    Complications: No apparent anesthesia complications

## 2014-08-12 NOTE — Op Note (Signed)
Date of Admission: 08/12/2014  Date of Surgery: 08/12/2014   Pre-Op Dx: Cataract Right Eye  Post-Op Dx: Senile Nuclear Cataract Right  Eye,  Dx Code H25.11  Surgeon: Tonny Branch, M.D.  Assistants: None  Anesthesia: Topical with MAC  Indications: Painless, progressive loss of vision with compromise of daily activities.  Surgery: Cataract Extraction with Intraocular lens Implant Right Eye  Discription: The patient had dilating drops and viscous lidocaine placed into the Right eye in the pre-op holding area. After transfer to the operating room, a time out was performed. The patient was then prepped and draped. Beginning with a 70 degree blade a paracentesis port was made at the surgeon's 2 o'clock position. The anterior chamber was then filled with 1% non-preserved lidocaine. This was followed by filling the anterior chamber with Provisc.  A 2.53mm keratome blade was used to make a clear corneal incision at the temporal limbus.  A bent cystatome needle was used to create a continuous tear capsulotomy. Hydrodissection was performed with balanced salt solution on a Fine canula. The lens nucleus was then removed using the phacoemulsification handpiece. Residual cortex was removed with the I&A handpiece. The anterior chamber and capsular bag were refilled with Provisc. A posterior chamber intraocular lens was placed into the capsular bag with it's injector. The implant was positioned with the Kuglan hook. The Provisc was then removed from the anterior chamber and capsular bag with the I&A handpiece. Stromal hydration of the main incision and paracentesis port was performed with BSS on a Fine canula. The wounds were tested for leak which was negative. The patient tolerated the procedure well. There were no operative complications. The patient was then transferred to the recovery room in stable condition.  Complications: None  Specimen: None  EBL: None  Prosthetic device: Hoya iSert 250, power 21.5 D,  SN [id number].

## 2014-08-12 NOTE — H&P (Signed)
I have reviewed the H&P, the patient was re-examined, and I have identified no interval changes in medical condition and plan of care since the history and physical of record  

## 2014-08-12 NOTE — Discharge Instructions (Signed)

## 2014-08-12 NOTE — Anesthesia Postprocedure Evaluation (Signed)
  Anesthesia Post-op Note  Patient: Isabel Preston  Procedure(s) Performed: Procedure(s) with comments: CATARACT EXTRACTION PHACO AND INTRAOCULAR LENS PLACEMENT RIGHT EYE (Right) - CDE:6.52  Patient Location: Short Stay  Anesthesia Type:MAC  Level of Consciousness: awake, alert  and oriented  Airway and Oxygen Therapy: Patient Spontanous Breathing  Post-op Pain: none  Post-op Assessment: Post-op Vital signs reviewed, Patient's Cardiovascular Status Stable, Respiratory Function Stable, Patent Airway and No signs of Nausea or vomiting              Post-op Vital Signs: Reviewed and stable  Last Vitals:  Filed Vitals:   08/12/14 1040  BP: 146/72  Pulse:   Temp:   Resp: 16    Complications: No apparent anesthesia complications

## 2014-08-13 ENCOUNTER — Encounter (HOSPITAL_COMMUNITY): Payer: Self-pay | Admitting: Ophthalmology

## 2016-01-08 ENCOUNTER — Other Ambulatory Visit (HOSPITAL_COMMUNITY): Payer: Self-pay | Admitting: Family Medicine

## 2016-01-08 DIAGNOSIS — Z1231 Encounter for screening mammogram for malignant neoplasm of breast: Secondary | ICD-10-CM

## 2016-01-08 DIAGNOSIS — M810 Age-related osteoporosis without current pathological fracture: Secondary | ICD-10-CM

## 2016-01-08 DIAGNOSIS — Z780 Asymptomatic menopausal state: Secondary | ICD-10-CM

## 2016-02-04 ENCOUNTER — Ambulatory Visit (HOSPITAL_COMMUNITY): Payer: Medicare Other

## 2016-02-04 ENCOUNTER — Ambulatory Visit (HOSPITAL_COMMUNITY)
Admission: RE | Admit: 2016-02-04 | Discharge: 2016-02-04 | Disposition: A | Discharge status: Home / Self Care | Payer: Medicare Other | Source: Ambulatory Visit | Attending: Family Medicine | Admitting: Family Medicine

## 2016-02-04 ENCOUNTER — Other Ambulatory Visit (HOSPITAL_COMMUNITY): Payer: Medicare Other

## 2016-02-04 DIAGNOSIS — Z780 Asymptomatic menopausal state: Secondary | ICD-10-CM | POA: Diagnosis present

## 2016-02-04 DIAGNOSIS — Z1231 Encounter for screening mammogram for malignant neoplasm of breast: Secondary | ICD-10-CM | POA: Diagnosis not present

## 2016-02-04 DIAGNOSIS — M810 Age-related osteoporosis without current pathological fracture: Secondary | ICD-10-CM | POA: Diagnosis not present

## 2016-02-04 DIAGNOSIS — M85851 Other specified disorders of bone density and structure, right thigh: Secondary | ICD-10-CM | POA: Diagnosis not present

## 2016-02-04 DIAGNOSIS — M8588 Other specified disorders of bone density and structure, other site: Secondary | ICD-10-CM | POA: Diagnosis not present

## 2016-02-04 DIAGNOSIS — E559 Vitamin D deficiency, unspecified: Secondary | ICD-10-CM | POA: Insufficient documentation

## 2019-04-05 ENCOUNTER — Other Ambulatory Visit (HOSPITAL_COMMUNITY): Payer: Self-pay | Admitting: Family Medicine

## 2019-04-05 DIAGNOSIS — M810 Age-related osteoporosis without current pathological fracture: Secondary | ICD-10-CM

## 2019-04-05 DIAGNOSIS — Z1231 Encounter for screening mammogram for malignant neoplasm of breast: Secondary | ICD-10-CM

## 2019-05-02 ENCOUNTER — Ambulatory Visit (HOSPITAL_COMMUNITY)
Admission: RE | Admit: 2019-05-02 | Discharge: 2019-05-02 | Disposition: A | Discharge status: Home / Self Care | Payer: Medicare Other | Source: Ambulatory Visit | Attending: Family Medicine | Admitting: Family Medicine

## 2019-05-02 ENCOUNTER — Other Ambulatory Visit: Payer: Self-pay

## 2019-05-02 DIAGNOSIS — Z1231 Encounter for screening mammogram for malignant neoplasm of breast: Secondary | ICD-10-CM | POA: Insufficient documentation

## 2019-05-02 DIAGNOSIS — M810 Age-related osteoporosis without current pathological fracture: Secondary | ICD-10-CM | POA: Diagnosis not present

## 2020-03-03 DIAGNOSIS — I10 Essential (primary) hypertension: Secondary | ICD-10-CM | POA: Diagnosis not present

## 2020-03-03 DIAGNOSIS — E785 Hyperlipidemia, unspecified: Secondary | ICD-10-CM | POA: Diagnosis not present

## 2020-03-03 DIAGNOSIS — K219 Gastro-esophageal reflux disease without esophagitis: Secondary | ICD-10-CM | POA: Diagnosis not present

## 2020-03-03 DIAGNOSIS — J45901 Unspecified asthma with (acute) exacerbation: Secondary | ICD-10-CM | POA: Diagnosis not present

## 2020-03-03 DIAGNOSIS — M810 Age-related osteoporosis without current pathological fracture: Secondary | ICD-10-CM | POA: Diagnosis not present

## 2020-03-03 DIAGNOSIS — J45909 Unspecified asthma, uncomplicated: Secondary | ICD-10-CM | POA: Diagnosis not present

## 2020-04-07 DIAGNOSIS — Z79899 Other long term (current) drug therapy: Secondary | ICD-10-CM | POA: Diagnosis not present

## 2020-04-07 DIAGNOSIS — M810 Age-related osteoporosis without current pathological fracture: Secondary | ICD-10-CM | POA: Diagnosis not present

## 2020-04-07 DIAGNOSIS — Z0000 Encounter for general adult medical examination without abnormal findings: Secondary | ICD-10-CM | POA: Diagnosis not present

## 2020-04-07 DIAGNOSIS — I10 Essential (primary) hypertension: Secondary | ICD-10-CM | POA: Diagnosis not present

## 2020-04-07 DIAGNOSIS — E785 Hyperlipidemia, unspecified: Secondary | ICD-10-CM | POA: Diagnosis not present

## 2020-04-07 DIAGNOSIS — J45909 Unspecified asthma, uncomplicated: Secondary | ICD-10-CM | POA: Diagnosis not present

## 2020-04-14 DIAGNOSIS — L820 Inflamed seborrheic keratosis: Secondary | ICD-10-CM | POA: Diagnosis not present

## 2020-04-14 DIAGNOSIS — L570 Actinic keratosis: Secondary | ICD-10-CM | POA: Diagnosis not present

## 2020-04-14 DIAGNOSIS — C44629 Squamous cell carcinoma of skin of left upper limb, including shoulder: Secondary | ICD-10-CM | POA: Diagnosis not present

## 2020-04-14 DIAGNOSIS — L905 Scar conditions and fibrosis of skin: Secondary | ICD-10-CM | POA: Diagnosis not present

## 2020-04-14 DIAGNOSIS — Z85828 Personal history of other malignant neoplasm of skin: Secondary | ICD-10-CM | POA: Diagnosis not present

## 2020-04-14 DIAGNOSIS — C4402 Squamous cell carcinoma of skin of lip: Secondary | ICD-10-CM | POA: Diagnosis not present

## 2020-04-14 DIAGNOSIS — C44712 Basal cell carcinoma of skin of right lower limb, including hip: Secondary | ICD-10-CM | POA: Diagnosis not present

## 2020-04-14 DIAGNOSIS — C44719 Basal cell carcinoma of skin of left lower limb, including hip: Secondary | ICD-10-CM | POA: Diagnosis not present

## 2020-04-14 DIAGNOSIS — D485 Neoplasm of uncertain behavior of skin: Secondary | ICD-10-CM | POA: Diagnosis not present

## 2020-04-14 DIAGNOSIS — L814 Other melanin hyperpigmentation: Secondary | ICD-10-CM | POA: Diagnosis not present

## 2020-04-16 DIAGNOSIS — L814 Other melanin hyperpigmentation: Secondary | ICD-10-CM | POA: Diagnosis not present

## 2020-05-01 DIAGNOSIS — K219 Gastro-esophageal reflux disease without esophagitis: Secondary | ICD-10-CM | POA: Diagnosis not present

## 2020-05-01 DIAGNOSIS — J45909 Unspecified asthma, uncomplicated: Secondary | ICD-10-CM | POA: Diagnosis not present

## 2020-05-01 DIAGNOSIS — J45901 Unspecified asthma with (acute) exacerbation: Secondary | ICD-10-CM | POA: Diagnosis not present

## 2020-05-01 DIAGNOSIS — E785 Hyperlipidemia, unspecified: Secondary | ICD-10-CM | POA: Diagnosis not present

## 2020-05-01 DIAGNOSIS — I10 Essential (primary) hypertension: Secondary | ICD-10-CM | POA: Diagnosis not present

## 2020-05-01 DIAGNOSIS — M810 Age-related osteoporosis without current pathological fracture: Secondary | ICD-10-CM | POA: Diagnosis not present

## 2020-05-12 DIAGNOSIS — C44719 Basal cell carcinoma of skin of left lower limb, including hip: Secondary | ICD-10-CM | POA: Diagnosis not present

## 2020-05-12 DIAGNOSIS — C44629 Squamous cell carcinoma of skin of left upper limb, including shoulder: Secondary | ICD-10-CM | POA: Diagnosis not present

## 2020-05-27 DIAGNOSIS — C4402 Squamous cell carcinoma of skin of lip: Secondary | ICD-10-CM | POA: Diagnosis not present

## 2020-05-27 DIAGNOSIS — D0462 Carcinoma in situ of skin of left upper limb, including shoulder: Secondary | ICD-10-CM | POA: Diagnosis not present

## 2020-05-29 DIAGNOSIS — E785 Hyperlipidemia, unspecified: Secondary | ICD-10-CM | POA: Diagnosis not present

## 2020-05-29 DIAGNOSIS — K219 Gastro-esophageal reflux disease without esophagitis: Secondary | ICD-10-CM | POA: Diagnosis not present

## 2020-05-29 DIAGNOSIS — J45909 Unspecified asthma, uncomplicated: Secondary | ICD-10-CM | POA: Diagnosis not present

## 2020-05-29 DIAGNOSIS — M810 Age-related osteoporosis without current pathological fracture: Secondary | ICD-10-CM | POA: Diagnosis not present

## 2020-05-29 DIAGNOSIS — J45901 Unspecified asthma with (acute) exacerbation: Secondary | ICD-10-CM | POA: Diagnosis not present

## 2020-05-29 DIAGNOSIS — I10 Essential (primary) hypertension: Secondary | ICD-10-CM | POA: Diagnosis not present

## 2020-06-17 DIAGNOSIS — C44712 Basal cell carcinoma of skin of right lower limb, including hip: Secondary | ICD-10-CM | POA: Diagnosis not present

## 2020-06-17 DIAGNOSIS — D0462 Carcinoma in situ of skin of left upper limb, including shoulder: Secondary | ICD-10-CM | POA: Diagnosis not present

## 2020-06-24 DIAGNOSIS — H401132 Primary open-angle glaucoma, bilateral, moderate stage: Secondary | ICD-10-CM | POA: Diagnosis not present

## 2020-07-14 DIAGNOSIS — M810 Age-related osteoporosis without current pathological fracture: Secondary | ICD-10-CM | POA: Diagnosis not present

## 2020-07-14 DIAGNOSIS — E785 Hyperlipidemia, unspecified: Secondary | ICD-10-CM | POA: Diagnosis not present

## 2020-07-14 DIAGNOSIS — I10 Essential (primary) hypertension: Secondary | ICD-10-CM | POA: Diagnosis not present

## 2020-07-14 DIAGNOSIS — J45901 Unspecified asthma with (acute) exacerbation: Secondary | ICD-10-CM | POA: Diagnosis not present

## 2020-07-14 DIAGNOSIS — J45909 Unspecified asthma, uncomplicated: Secondary | ICD-10-CM | POA: Diagnosis not present

## 2020-07-14 DIAGNOSIS — K219 Gastro-esophageal reflux disease without esophagitis: Secondary | ICD-10-CM | POA: Diagnosis not present

## 2020-08-28 DIAGNOSIS — M7989 Other specified soft tissue disorders: Secondary | ICD-10-CM | POA: Diagnosis not present

## 2020-08-29 DIAGNOSIS — E785 Hyperlipidemia, unspecified: Secondary | ICD-10-CM | POA: Diagnosis not present

## 2020-08-29 DIAGNOSIS — I10 Essential (primary) hypertension: Secondary | ICD-10-CM | POA: Diagnosis not present

## 2020-08-29 DIAGNOSIS — M810 Age-related osteoporosis without current pathological fracture: Secondary | ICD-10-CM | POA: Diagnosis not present

## 2020-08-29 DIAGNOSIS — J45901 Unspecified asthma with (acute) exacerbation: Secondary | ICD-10-CM | POA: Diagnosis not present

## 2020-08-29 DIAGNOSIS — J45909 Unspecified asthma, uncomplicated: Secondary | ICD-10-CM | POA: Diagnosis not present

## 2020-08-29 DIAGNOSIS — K219 Gastro-esophageal reflux disease without esophagitis: Secondary | ICD-10-CM | POA: Diagnosis not present

## 2020-09-08 DIAGNOSIS — S90862A Insect bite (nonvenomous), left foot, initial encounter: Secondary | ICD-10-CM | POA: Diagnosis not present

## 2020-09-30 DIAGNOSIS — J45909 Unspecified asthma, uncomplicated: Secondary | ICD-10-CM | POA: Diagnosis not present

## 2020-09-30 DIAGNOSIS — M810 Age-related osteoporosis without current pathological fracture: Secondary | ICD-10-CM | POA: Diagnosis not present

## 2020-09-30 DIAGNOSIS — E785 Hyperlipidemia, unspecified: Secondary | ICD-10-CM | POA: Diagnosis not present

## 2020-09-30 DIAGNOSIS — I10 Essential (primary) hypertension: Secondary | ICD-10-CM | POA: Diagnosis not present

## 2020-09-30 DIAGNOSIS — K219 Gastro-esophageal reflux disease without esophagitis: Secondary | ICD-10-CM | POA: Diagnosis not present

## 2020-09-30 DIAGNOSIS — J45901 Unspecified asthma with (acute) exacerbation: Secondary | ICD-10-CM | POA: Diagnosis not present

## 2020-10-10 DIAGNOSIS — K219 Gastro-esophageal reflux disease without esophagitis: Secondary | ICD-10-CM | POA: Diagnosis not present

## 2020-10-10 DIAGNOSIS — I10 Essential (primary) hypertension: Secondary | ICD-10-CM | POA: Diagnosis not present

## 2020-10-10 DIAGNOSIS — J45909 Unspecified asthma, uncomplicated: Secondary | ICD-10-CM | POA: Diagnosis not present

## 2020-10-10 DIAGNOSIS — M810 Age-related osteoporosis without current pathological fracture: Secondary | ICD-10-CM | POA: Diagnosis not present

## 2020-10-10 DIAGNOSIS — E785 Hyperlipidemia, unspecified: Secondary | ICD-10-CM | POA: Diagnosis not present

## 2020-10-10 DIAGNOSIS — Z23 Encounter for immunization: Secondary | ICD-10-CM | POA: Diagnosis not present

## 2020-10-14 DIAGNOSIS — I10 Essential (primary) hypertension: Secondary | ICD-10-CM | POA: Diagnosis not present

## 2020-10-14 DIAGNOSIS — J45901 Unspecified asthma with (acute) exacerbation: Secondary | ICD-10-CM | POA: Diagnosis not present

## 2020-10-14 DIAGNOSIS — J45909 Unspecified asthma, uncomplicated: Secondary | ICD-10-CM | POA: Diagnosis not present

## 2020-10-14 DIAGNOSIS — K219 Gastro-esophageal reflux disease without esophagitis: Secondary | ICD-10-CM | POA: Diagnosis not present

## 2020-10-14 DIAGNOSIS — E785 Hyperlipidemia, unspecified: Secondary | ICD-10-CM | POA: Diagnosis not present

## 2020-10-14 DIAGNOSIS — M810 Age-related osteoporosis without current pathological fracture: Secondary | ICD-10-CM | POA: Diagnosis not present

## 2020-10-20 DIAGNOSIS — C44629 Squamous cell carcinoma of skin of left upper limb, including shoulder: Secondary | ICD-10-CM | POA: Diagnosis not present

## 2020-10-20 DIAGNOSIS — S9032XA Contusion of left foot, initial encounter: Secondary | ICD-10-CM | POA: Diagnosis not present

## 2020-10-20 DIAGNOSIS — D485 Neoplasm of uncertain behavior of skin: Secondary | ICD-10-CM | POA: Diagnosis not present

## 2020-10-21 DIAGNOSIS — I10 Essential (primary) hypertension: Secondary | ICD-10-CM | POA: Diagnosis not present

## 2020-11-03 DIAGNOSIS — L905 Scar conditions and fibrosis of skin: Secondary | ICD-10-CM | POA: Diagnosis not present

## 2020-11-03 DIAGNOSIS — C44629 Squamous cell carcinoma of skin of left upper limb, including shoulder: Secondary | ICD-10-CM | POA: Diagnosis not present

## 2020-12-19 DIAGNOSIS — M810 Age-related osteoporosis without current pathological fracture: Secondary | ICD-10-CM | POA: Diagnosis not present

## 2020-12-19 DIAGNOSIS — J45909 Unspecified asthma, uncomplicated: Secondary | ICD-10-CM | POA: Diagnosis not present

## 2020-12-19 DIAGNOSIS — I10 Essential (primary) hypertension: Secondary | ICD-10-CM | POA: Diagnosis not present

## 2020-12-19 DIAGNOSIS — K219 Gastro-esophageal reflux disease without esophagitis: Secondary | ICD-10-CM | POA: Diagnosis not present

## 2020-12-19 DIAGNOSIS — E785 Hyperlipidemia, unspecified: Secondary | ICD-10-CM | POA: Diagnosis not present

## 2020-12-22 DIAGNOSIS — H401192 Primary open-angle glaucoma, unspecified eye, moderate stage: Secondary | ICD-10-CM | POA: Diagnosis not present

## 2021-01-07 DIAGNOSIS — T4995XA Adverse effect of unspecified topical agent, initial encounter: Secondary | ICD-10-CM | POA: Diagnosis not present

## 2021-01-07 DIAGNOSIS — J309 Allergic rhinitis, unspecified: Secondary | ICD-10-CM | POA: Diagnosis not present

## 2021-01-07 DIAGNOSIS — J45909 Unspecified asthma, uncomplicated: Secondary | ICD-10-CM | POA: Diagnosis not present

## 2021-01-07 DIAGNOSIS — I10 Essential (primary) hypertension: Secondary | ICD-10-CM | POA: Diagnosis not present

## 2021-01-09 DIAGNOSIS — M810 Age-related osteoporosis without current pathological fracture: Secondary | ICD-10-CM | POA: Diagnosis not present

## 2021-01-09 DIAGNOSIS — K219 Gastro-esophageal reflux disease without esophagitis: Secondary | ICD-10-CM | POA: Diagnosis not present

## 2021-01-09 DIAGNOSIS — I10 Essential (primary) hypertension: Secondary | ICD-10-CM | POA: Diagnosis not present

## 2021-01-09 DIAGNOSIS — E785 Hyperlipidemia, unspecified: Secondary | ICD-10-CM | POA: Diagnosis not present

## 2021-01-14 DIAGNOSIS — L570 Actinic keratosis: Secondary | ICD-10-CM | POA: Diagnosis not present

## 2021-01-14 DIAGNOSIS — D485 Neoplasm of uncertain behavior of skin: Secondary | ICD-10-CM | POA: Diagnosis not present

## 2021-02-02 DIAGNOSIS — S81802A Unspecified open wound, left lower leg, initial encounter: Secondary | ICD-10-CM | POA: Diagnosis not present

## 2021-03-03 DIAGNOSIS — J45909 Unspecified asthma, uncomplicated: Secondary | ICD-10-CM | POA: Diagnosis not present

## 2021-03-03 DIAGNOSIS — I10 Essential (primary) hypertension: Secondary | ICD-10-CM | POA: Diagnosis not present

## 2021-03-03 DIAGNOSIS — E785 Hyperlipidemia, unspecified: Secondary | ICD-10-CM | POA: Diagnosis not present

## 2021-03-03 DIAGNOSIS — M810 Age-related osteoporosis without current pathological fracture: Secondary | ICD-10-CM | POA: Diagnosis not present

## 2021-03-05 DIAGNOSIS — L578 Other skin changes due to chronic exposure to nonionizing radiation: Secondary | ICD-10-CM | POA: Diagnosis not present

## 2021-03-05 DIAGNOSIS — L570 Actinic keratosis: Secondary | ICD-10-CM | POA: Diagnosis not present

## 2021-03-09 DIAGNOSIS — R350 Frequency of micturition: Secondary | ICD-10-CM | POA: Diagnosis not present

## 2021-03-09 DIAGNOSIS — I10 Essential (primary) hypertension: Secondary | ICD-10-CM | POA: Diagnosis not present

## 2021-03-09 DIAGNOSIS — M629 Disorder of muscle, unspecified: Secondary | ICD-10-CM | POA: Diagnosis not present

## 2021-04-07 DIAGNOSIS — K219 Gastro-esophageal reflux disease without esophagitis: Secondary | ICD-10-CM | POA: Diagnosis not present

## 2021-04-07 DIAGNOSIS — E785 Hyperlipidemia, unspecified: Secondary | ICD-10-CM | POA: Diagnosis not present

## 2021-04-07 DIAGNOSIS — I10 Essential (primary) hypertension: Secondary | ICD-10-CM | POA: Diagnosis not present

## 2021-04-07 DIAGNOSIS — M810 Age-related osteoporosis without current pathological fracture: Secondary | ICD-10-CM | POA: Diagnosis not present

## 2021-04-07 DIAGNOSIS — Z1231 Encounter for screening mammogram for malignant neoplasm of breast: Secondary | ICD-10-CM | POA: Diagnosis not present

## 2021-04-07 DIAGNOSIS — Z0000 Encounter for general adult medical examination without abnormal findings: Secondary | ICD-10-CM | POA: Diagnosis not present

## 2021-04-08 ENCOUNTER — Other Ambulatory Visit (HOSPITAL_COMMUNITY): Payer: Self-pay | Admitting: Family Medicine

## 2021-04-08 DIAGNOSIS — Z780 Asymptomatic menopausal state: Secondary | ICD-10-CM

## 2021-04-08 DIAGNOSIS — Z1231 Encounter for screening mammogram for malignant neoplasm of breast: Secondary | ICD-10-CM

## 2021-04-17 DIAGNOSIS — I10 Essential (primary) hypertension: Secondary | ICD-10-CM | POA: Diagnosis not present

## 2021-04-17 DIAGNOSIS — M810 Age-related osteoporosis without current pathological fracture: Secondary | ICD-10-CM | POA: Diagnosis not present

## 2021-05-04 ENCOUNTER — Ambulatory Visit (HOSPITAL_COMMUNITY): Payer: Medicare Other

## 2021-05-04 ENCOUNTER — Other Ambulatory Visit (HOSPITAL_COMMUNITY): Payer: Medicare Other

## 2021-05-04 DIAGNOSIS — Z09 Encounter for follow-up examination after completed treatment for conditions other than malignant neoplasm: Secondary | ICD-10-CM | POA: Diagnosis not present

## 2021-05-04 DIAGNOSIS — Z872 Personal history of diseases of the skin and subcutaneous tissue: Secondary | ICD-10-CM | POA: Diagnosis not present

## 2021-05-04 DIAGNOSIS — L578 Other skin changes due to chronic exposure to nonionizing radiation: Secondary | ICD-10-CM | POA: Diagnosis not present

## 2021-05-18 ENCOUNTER — Ambulatory Visit (HOSPITAL_COMMUNITY)
Admission: RE | Admit: 2021-05-18 | Discharge: 2021-05-18 | Disposition: A | Discharge status: Home / Self Care | Payer: Medicare Other | Source: Ambulatory Visit | Attending: Family Medicine | Admitting: Family Medicine

## 2021-05-18 DIAGNOSIS — Z780 Asymptomatic menopausal state: Secondary | ICD-10-CM | POA: Insufficient documentation

## 2021-05-18 DIAGNOSIS — M85852 Other specified disorders of bone density and structure, left thigh: Secondary | ICD-10-CM | POA: Diagnosis not present

## 2021-05-18 DIAGNOSIS — Z1231 Encounter for screening mammogram for malignant neoplasm of breast: Secondary | ICD-10-CM | POA: Diagnosis not present

## 2021-05-18 DIAGNOSIS — M810 Age-related osteoporosis without current pathological fracture: Secondary | ICD-10-CM | POA: Diagnosis not present

## 2021-06-02 DIAGNOSIS — Z872 Personal history of diseases of the skin and subcutaneous tissue: Secondary | ICD-10-CM | POA: Diagnosis not present

## 2021-06-02 DIAGNOSIS — L578 Other skin changes due to chronic exposure to nonionizing radiation: Secondary | ICD-10-CM | POA: Diagnosis not present

## 2021-06-02 DIAGNOSIS — L905 Scar conditions and fibrosis of skin: Secondary | ICD-10-CM | POA: Diagnosis not present

## 2021-06-02 DIAGNOSIS — Z09 Encounter for follow-up examination after completed treatment for conditions other than malignant neoplasm: Secondary | ICD-10-CM | POA: Diagnosis not present

## 2021-07-17 DIAGNOSIS — M810 Age-related osteoporosis without current pathological fracture: Secondary | ICD-10-CM | POA: Diagnosis not present

## 2021-07-17 DIAGNOSIS — K219 Gastro-esophageal reflux disease without esophagitis: Secondary | ICD-10-CM | POA: Diagnosis not present

## 2021-07-17 DIAGNOSIS — E785 Hyperlipidemia, unspecified: Secondary | ICD-10-CM | POA: Diagnosis not present

## 2021-07-17 DIAGNOSIS — I10 Essential (primary) hypertension: Secondary | ICD-10-CM | POA: Diagnosis not present

## 2021-08-10 DIAGNOSIS — J45909 Unspecified asthma, uncomplicated: Secondary | ICD-10-CM | POA: Diagnosis not present

## 2021-08-10 DIAGNOSIS — I10 Essential (primary) hypertension: Secondary | ICD-10-CM | POA: Diagnosis not present

## 2021-08-10 DIAGNOSIS — E785 Hyperlipidemia, unspecified: Secondary | ICD-10-CM | POA: Diagnosis not present

## 2021-08-10 DIAGNOSIS — M810 Age-related osteoporosis without current pathological fracture: Secondary | ICD-10-CM | POA: Diagnosis not present

## 2021-09-03 DIAGNOSIS — H401132 Primary open-angle glaucoma, bilateral, moderate stage: Secondary | ICD-10-CM | POA: Diagnosis not present

## 2021-09-14 DIAGNOSIS — Z08 Encounter for follow-up examination after completed treatment for malignant neoplasm: Secondary | ICD-10-CM | POA: Diagnosis not present

## 2021-09-14 DIAGNOSIS — L821 Other seborrheic keratosis: Secondary | ICD-10-CM | POA: Diagnosis not present

## 2021-09-14 DIAGNOSIS — D485 Neoplasm of uncertain behavior of skin: Secondary | ICD-10-CM | POA: Diagnosis not present

## 2021-09-14 DIAGNOSIS — C44629 Squamous cell carcinoma of skin of left upper limb, including shoulder: Secondary | ICD-10-CM | POA: Diagnosis not present

## 2021-09-14 DIAGNOSIS — Z85828 Personal history of other malignant neoplasm of skin: Secondary | ICD-10-CM | POA: Diagnosis not present

## 2021-09-14 DIAGNOSIS — R229 Localized swelling, mass and lump, unspecified: Secondary | ICD-10-CM | POA: Diagnosis not present

## 2021-09-14 DIAGNOSIS — C44712 Basal cell carcinoma of skin of right lower limb, including hip: Secondary | ICD-10-CM | POA: Diagnosis not present

## 2021-09-14 DIAGNOSIS — L570 Actinic keratosis: Secondary | ICD-10-CM | POA: Diagnosis not present

## 2021-09-14 DIAGNOSIS — C44719 Basal cell carcinoma of skin of left lower limb, including hip: Secondary | ICD-10-CM | POA: Diagnosis not present

## 2021-10-06 DIAGNOSIS — I10 Essential (primary) hypertension: Secondary | ICD-10-CM | POA: Diagnosis not present

## 2021-10-15 DIAGNOSIS — E785 Hyperlipidemia, unspecified: Secondary | ICD-10-CM | POA: Diagnosis not present

## 2021-10-15 DIAGNOSIS — I10 Essential (primary) hypertension: Secondary | ICD-10-CM | POA: Diagnosis not present

## 2021-10-15 DIAGNOSIS — J45909 Unspecified asthma, uncomplicated: Secondary | ICD-10-CM | POA: Diagnosis not present

## 2021-10-15 DIAGNOSIS — M810 Age-related osteoporosis without current pathological fracture: Secondary | ICD-10-CM | POA: Diagnosis not present

## 2021-10-19 DIAGNOSIS — C44712 Basal cell carcinoma of skin of right lower limb, including hip: Secondary | ICD-10-CM | POA: Diagnosis not present

## 2021-11-02 DIAGNOSIS — Z23 Encounter for immunization: Secondary | ICD-10-CM | POA: Diagnosis not present

## 2021-11-02 DIAGNOSIS — C44719 Basal cell carcinoma of skin of left lower limb, including hip: Secondary | ICD-10-CM | POA: Diagnosis not present

## 2021-11-16 DIAGNOSIS — I10 Essential (primary) hypertension: Secondary | ICD-10-CM | POA: Diagnosis not present

## 2021-11-23 DIAGNOSIS — D0462 Carcinoma in situ of skin of left upper limb, including shoulder: Secondary | ICD-10-CM | POA: Diagnosis not present

## 2022-01-12 DIAGNOSIS — M810 Age-related osteoporosis without current pathological fracture: Secondary | ICD-10-CM | POA: Diagnosis not present

## 2022-01-12 DIAGNOSIS — E785 Hyperlipidemia, unspecified: Secondary | ICD-10-CM | POA: Diagnosis not present

## 2022-01-12 DIAGNOSIS — K219 Gastro-esophageal reflux disease without esophagitis: Secondary | ICD-10-CM | POA: Diagnosis not present

## 2022-01-12 DIAGNOSIS — I10 Essential (primary) hypertension: Secondary | ICD-10-CM | POA: Diagnosis not present

## 2022-01-20 DIAGNOSIS — J209 Acute bronchitis, unspecified: Secondary | ICD-10-CM | POA: Diagnosis not present

## 2022-01-20 DIAGNOSIS — Z9181 History of falling: Secondary | ICD-10-CM | POA: Diagnosis not present

## 2022-02-03 DIAGNOSIS — J069 Acute upper respiratory infection, unspecified: Secondary | ICD-10-CM | POA: Diagnosis not present

## 2022-02-03 DIAGNOSIS — Z8709 Personal history of other diseases of the respiratory system: Secondary | ICD-10-CM | POA: Diagnosis not present

## 2022-02-17 DIAGNOSIS — I10 Essential (primary) hypertension: Secondary | ICD-10-CM | POA: Diagnosis not present

## 2022-02-17 DIAGNOSIS — M810 Age-related osteoporosis without current pathological fracture: Secondary | ICD-10-CM | POA: Diagnosis not present

## 2022-02-17 DIAGNOSIS — E785 Hyperlipidemia, unspecified: Secondary | ICD-10-CM | POA: Diagnosis not present

## 2022-02-17 DIAGNOSIS — J45909 Unspecified asthma, uncomplicated: Secondary | ICD-10-CM | POA: Diagnosis not present

## 2022-02-17 DIAGNOSIS — K219 Gastro-esophageal reflux disease without esophagitis: Secondary | ICD-10-CM | POA: Diagnosis not present

## 2022-02-22 DIAGNOSIS — R42 Dizziness and giddiness: Secondary | ICD-10-CM | POA: Diagnosis not present

## 2022-03-09 DIAGNOSIS — H401132 Primary open-angle glaucoma, bilateral, moderate stage: Secondary | ICD-10-CM | POA: Diagnosis not present

## 2022-03-16 DIAGNOSIS — E785 Hyperlipidemia, unspecified: Secondary | ICD-10-CM | POA: Diagnosis not present

## 2022-03-16 DIAGNOSIS — J45909 Unspecified asthma, uncomplicated: Secondary | ICD-10-CM | POA: Diagnosis not present

## 2022-03-16 DIAGNOSIS — I10 Essential (primary) hypertension: Secondary | ICD-10-CM | POA: Diagnosis not present

## 2022-03-16 DIAGNOSIS — M810 Age-related osteoporosis without current pathological fracture: Secondary | ICD-10-CM | POA: Diagnosis not present

## 2022-04-13 DIAGNOSIS — Z1211 Encounter for screening for malignant neoplasm of colon: Secondary | ICD-10-CM | POA: Diagnosis not present

## 2022-04-13 DIAGNOSIS — R011 Cardiac murmur, unspecified: Secondary | ICD-10-CM | POA: Diagnosis not present

## 2022-04-13 DIAGNOSIS — Z1231 Encounter for screening mammogram for malignant neoplasm of breast: Secondary | ICD-10-CM | POA: Diagnosis not present

## 2022-04-13 DIAGNOSIS — Z23 Encounter for immunization: Secondary | ICD-10-CM | POA: Diagnosis not present

## 2022-04-13 DIAGNOSIS — Z0000 Encounter for general adult medical examination without abnormal findings: Secondary | ICD-10-CM | POA: Diagnosis not present

## 2022-04-13 DIAGNOSIS — I10 Essential (primary) hypertension: Secondary | ICD-10-CM | POA: Diagnosis not present

## 2022-04-13 DIAGNOSIS — M810 Age-related osteoporosis without current pathological fracture: Secondary | ICD-10-CM | POA: Diagnosis not present

## 2022-04-13 DIAGNOSIS — Z85828 Personal history of other malignant neoplasm of skin: Secondary | ICD-10-CM | POA: Diagnosis not present

## 2022-04-13 DIAGNOSIS — J4540 Moderate persistent asthma, uncomplicated: Secondary | ICD-10-CM | POA: Diagnosis not present

## 2022-04-13 DIAGNOSIS — E782 Mixed hyperlipidemia: Secondary | ICD-10-CM | POA: Diagnosis not present

## 2022-04-15 DIAGNOSIS — C44729 Squamous cell carcinoma of skin of left lower limb, including hip: Secondary | ICD-10-CM | POA: Diagnosis not present

## 2022-04-15 DIAGNOSIS — R229 Localized swelling, mass and lump, unspecified: Secondary | ICD-10-CM | POA: Diagnosis not present

## 2022-04-15 DIAGNOSIS — Z85828 Personal history of other malignant neoplasm of skin: Secondary | ICD-10-CM | POA: Diagnosis not present

## 2022-04-15 DIAGNOSIS — D0462 Carcinoma in situ of skin of left upper limb, including shoulder: Secondary | ICD-10-CM | POA: Diagnosis not present

## 2022-04-15 DIAGNOSIS — Z08 Encounter for follow-up examination after completed treatment for malignant neoplasm: Secondary | ICD-10-CM | POA: Diagnosis not present

## 2022-04-15 DIAGNOSIS — D485 Neoplasm of uncertain behavior of skin: Secondary | ICD-10-CM | POA: Diagnosis not present

## 2022-04-15 DIAGNOSIS — C44629 Squamous cell carcinoma of skin of left upper limb, including shoulder: Secondary | ICD-10-CM | POA: Diagnosis not present

## 2022-05-10 ENCOUNTER — Ambulatory Visit: Payer: Medicare Other | Admitting: Cardiology

## 2022-05-17 ENCOUNTER — Ambulatory Visit: Payer: Medicare Other | Admitting: Cardiology

## 2022-05-17 DIAGNOSIS — C44729 Squamous cell carcinoma of skin of left lower limb, including hip: Secondary | ICD-10-CM | POA: Diagnosis not present

## 2022-05-24 ENCOUNTER — Ambulatory Visit: Payer: Medicare Other | Admitting: Cardiology

## 2022-06-14 ENCOUNTER — Ambulatory Visit: Payer: Medicare Other | Admitting: Cardiology

## 2022-06-14 ENCOUNTER — Other Ambulatory Visit: Payer: Self-pay

## 2022-06-14 ENCOUNTER — Encounter: Payer: Self-pay | Admitting: Cardiology

## 2022-06-14 VITALS — BP 169/83 | HR 88 | Resp 16 | Ht 64.0 in | Wt 160.4 lb

## 2022-06-14 DIAGNOSIS — I10 Essential (primary) hypertension: Secondary | ICD-10-CM

## 2022-06-14 DIAGNOSIS — R072 Precordial pain: Secondary | ICD-10-CM

## 2022-06-14 DIAGNOSIS — R011 Cardiac murmur, unspecified: Secondary | ICD-10-CM

## 2022-06-14 DIAGNOSIS — E782 Mixed hyperlipidemia: Secondary | ICD-10-CM | POA: Diagnosis not present

## 2022-06-14 DIAGNOSIS — R0989 Other specified symptoms and signs involving the circulatory and respiratory systems: Secondary | ICD-10-CM

## 2022-06-14 MED ORDER — CARVEDILOL 12.5 MG PO TABS: 12.5000 mg | ORAL_TABLET | Freq: Two times a day (BID) | ORAL | 0 refills | Status: DC

## 2022-06-14 NOTE — Progress Notes (Signed)
ID:  Isabel Preston, DOB 09/02/1940, MRN [id number]  PCP:  Koren Shiver, DO  Cardiologist:  Tessa Lerner, DO, [facility] (established care 06/14/2022)  REASON FOR CONSULT: Cardiac murmur  REQUESTING PHYSICIAN:  [place], Betty G, MD [address] [place],  [place] [address]  Chief Complaint  Patient presents with   Heart Murmur   New Patient (Initial Visit)    HPI  Isabel Preston is a 82 y.o. Caucasian female who presents to the clinic for evaluation of cardiac murmur at the request of [place], Betty G, MD. Her past medical history and cardiovascular risk factors include: Hypertension, hyperlipidemia, major depressive disorder, asthma, history of glaucoma.   Patient was referred to the practice for evaluation of a cardiac murmur; however, voices chest pain.  She is accompanied by her granddaughter Isabel Preston at today's office visit and patient provides verbal consent with having her present.  Chest pain: Ongoing for the last 1 month, left-sided over the anterior chest and lateral wall, intensity 7 out of 10, describes it as a sharp/ache-like sensation, lasting for a few seconds, self-limited.  The pain is not brought on by effort related activities and does not resolve with rest.  She has been attributing the discomfort to neuropathy or nerve pain.  No history of breast cancer and she has her annual mammograms.  Reason of consultation is for cardiac murmur which she does not voice.  In December 2023/January 2024 she was having symptoms of URI and was taking DayQuil and NyQuil and had an episode of syncope.  Once her URI symptoms resolved and did not recur.  FUNCTIONAL STATUS: Enjoys doing yard work. Does have a stationary bike which she does 15 minutes at a time.  ALLERGIES: Allergies  Allergen Reactions   Codeine Nausea And Vomiting   Iodinated Contrast Media Other (See Comments)    Heart starts racing   Prednisone Nausea And Vomiting   Sulfa Antibiotics Nausea And Vomiting    Vibra-Tab [Doxycycline] Nausea And Vomiting    MEDICATION LIST PRIOR TO VISIT: Current Meds  Medication Sig   albuterol (VENTOLIN HFA) 108 (90 Base) MCG/ACT inhaler Inhale into the lungs every 6 (six) hours as needed for wheezing or shortness of breath.   alendronate (FOSAMAX) 70 MG tablet Take 70 mg by mouth once a week.   aspirin EC 81 MG tablet Take 81 mg by mouth daily.   atorvastatin (LIPITOR) 10 MG tablet Take 10 mg by mouth daily.   Calcium Carbonate-Vitamin D (CALTRATE 600+D PO) Take 1 tablet by mouth 2 (two) times a week.   fluticasone (FLONASE) 50 MCG/ACT nasal spray Place 1 spray into both nostrils daily.   losartan (COZAAR) 100 MG tablet Take 100 mg by mouth daily.   montelukast (SINGULAIR) 10 MG tablet Take 10 mg by mouth daily.   multivitamin-iron-minerals-folic acid (CENTRUM) chewable tablet Chew 1 tablet by mouth every other day.   sertraline (ZOLOFT) 50 MG tablet Take 50 mg by mouth daily.   TRAVATAN Z 0.004 % SOLN ophthalmic solution Place 1 drop into both eyes at bedtime.   [DISCONTINUED] amLODipine (NORVASC) 5 MG tablet Take 5 mg by mouth daily.   [DISCONTINUED] carvedilol (COREG) 6.25 MG tablet Take 6.25 mg by mouth 2 (two) times daily.   [DISCONTINUED] losartan-hydrochlorothiazide (HYZAAR) 100-12.5 MG tablet Take 1 tablet by mouth daily.     PAST MEDICAL HISTORY: Past Medical History:  Diagnosis Date   Asthma    Cataract    Depression    Glaucoma  Hypercholesteremia    Hypertension    Skin cancer     PAST SURGICAL HISTORY: Past Surgical History:  Procedure Laterality Date   CATARACT EXTRACTION W/PHACO Left 07/25/2014   Procedure: CATARACT EXTRACTION PHACO AND INTRAOCULAR LENS PLACEMENT ; CDE:  5.83;  Surgeon: Gemma Payor, MD;  Location: [facility];  Service: Ophthalmology;  Laterality: Left;   CATARACT EXTRACTION W/PHACO Right 08/12/2014   Procedure: CATARACT EXTRACTION PHACO AND INTRAOCULAR LENS PLACEMENT RIGHT EYE;  Surgeon: Gemma Payor, MD;  Location: [facility];  Service: Ophthalmology;  Laterality: Right;  CDE:6.52   SKIN CANCER EXCISION      FAMILY HISTORY: The patient family history includes Alzheimer's disease in her sister; Arthritis in her sister; Cancer - Lung in her brother, brother, brother, brother, brother, sister, sister, sister, and sister; Heart failure in her mother; Pneumonia in her brother and father.  SOCIAL HISTORY:  The patient  reports that she has never smoked. She does not have any smokeless tobacco history on file. She reports that she does not drink alcohol and does not use drugs.  REVIEW OF SYSTEMS: Review of Systems  Cardiovascular:  Positive for chest pain. Negative for claudication, dyspnea on exertion, irregular heartbeat, leg swelling, near-syncope, orthopnea, palpitations, paroxysmal nocturnal dyspnea and syncope.  Respiratory:  Negative for shortness of breath.   Hematologic/Lymphatic: Negative for bleeding problem.  Musculoskeletal:  Negative for muscle cramps and myalgias.  Neurological:  Negative for dizziness and light-headedness.    PHYSICAL EXAM:    06/14/2022    1:38 PM 08/12/2014   10:40 AM 08/12/2014   10:35 AM  Vitals with BMI  Height 5\' 4"     Weight 160 lbs 6 oz    BMI 27.52    Systolic 169 146 161  Diastolic 83 72 65  Pulse 88      Physical Exam  Constitutional: No distress.  Age appropriate, hemodynamically stable.   Neck: No JVD present.  Cardiovascular: Normal rate, regular rhythm, S1 normal, S2 normal, intact distal pulses and normal pulses. Exam reveals no gallop, no S3 and no S4.  Murmur heard. Harsh crescendo-decrescendo systolic murmur is present with a grade of 3/6 at the upper right sternal border radiating to the neck. Pulses:      Carotid pulses are  on the right side with bruit and  on the left side with bruit.      Dorsalis pedis pulses are 2+ on the right side and 2+ on the left side.       Posterior tibial pulses are 2+ on the right side and 2+ on the left side.   Pulmonary/Chest: Effort normal and breath sounds normal. No stridor. She has no wheezes. She has no rales.  Abdominal: Soft. Bowel sounds are normal. She exhibits no distension. There is no abdominal tenderness.  Musculoskeletal:        General: Edema present.     Cervical back: Neck supple.  Neurological: She is alert and oriented to person, place, and time. She has intact cranial nerves (2-12).  Skin: Skin is warm and moist.   CARDIAC DATABASE: EKG: Jun 14, 2022: Sinus rhythm, 81 bpm, without underlying ischemia injury pattern.  Echocardiogram: No results found for this or any previous visit from the past 1095 days.    Stress Testing: No results found for this or any previous visit from the past 1095 days.   Heart Catheterization: None  LABORATORY DATA: External Labs: Collected: April 13, 2022 provided by PCP. BUN 13, creatinine 0.71. eGFR 85.  Sodium 131, potassium 4.6, chloride 95, bicarb 31 Total cholesterol 180, triglycerides 61, HDL 87, calculated LDL 81, non-HDL 93   IMPRESSION:    ICD-10-CM   1. Precordial chest pain  R07.2 carvedilol (COREG) 12.5 MG tablet    2. Murmur, cardiac  R01.1 EKG 12-Lead    ECHOCARDIOGRAM COMPLETE    3. Bilateral carotid bruits  R09.89 PCV CAROTID DUPLEX (BILATERAL)    4. Benign hypertension  I10 carvedilol (COREG) 12.5 MG tablet    5. Mixed hyperlipidemia  E78.2        RECOMMENDATIONS: KEINA BODEN is a 82 y.o. Caucasian female whose past medical history and cardiac risk factors include: Hypertension, hyperlipidemia, major depressive disorder, asthma, history of glaucoma.   Precordial chest pain Noncardiac. EKG sinus rhythm without ischemia or injury pattern. Echo will be ordered to evaluate for structural heart disease and left ventricular systolic function. Based on the severity of valvular heart disease we will determine stress test modality. Patient and granddaughter are agreeable with the plan of care. Educated on  seeking medical attention sooner by going to the closest ER via EMS if the symptoms increase in intensity, frequency, duration, or has typical chest pain as discussed in the office.  Patient verbalized understanding.  Murmur, cardiac Based on auscultation patient likely has moderate to severe aortic stenosis. No prior echo is available for review. Current chest pain predominantly noncardiac. Last syncopal event in December 2023/January 2024-patient attributing it to being on DayQuil/NyQuil Patient to seek medical attention if she has worsening chest pain, heart failure symptoms, or syncope. Educated her on the importance of avoiding dehydration  Bilateral carotid bruits Either secondary to underlying valvular heart disease or organic disease. Carotid duplex ordered to evaluate for organic disease  Benign hypertension Office blood pressures are currently not at goal. Blood pressure readings improved on recheck to 169/83. Patient states that her blood pressures at home vary from 120-150 mmHg. Discontinue amlodipine due to lower extremity swelling Increase carvedilol to 12.5 mg p.o. twice daily Avoid diuretics given underlying valvular disease  Mixed hyperlipidemia Currently on atorvastatin.   She denies myalgia or other side effects. Most recent lipids dated March 2024 reviewed as noted above. Currently managed by primary care provider.  Data Reviewed: I have independently reviewed external notes provided by the referring provider as part of this office visit.   I have independently reviewed results of EKG, labs as part of medical decision making. I have ordered the following tests:  Orders Placed This Encounter  Procedures   EKG 12-Lead   ECHOCARDIOGRAM COMPLETE    Standing Status:   Future    Standing Expiration Date:   06/14/2023    Order Specific Question:   Where should this test be performed    Answer:   [facility]    Order Specific Question:   Perflutren DEFINITY (image  enhancing agent) should be administered unless hypersensitivity or allergy exist    Answer:   Administer Perflutren    Order Specific Question:   Reason for exam-Echo    Answer:   Murmur R01.1   I have made medications changes at today's encounter as noted above. History of present illness was obtained by both patient and granddaughter at today's office visit.   FINAL MEDICATION LIST END OF ENCOUNTER: Meds ordered this encounter  Medications   carvedilol (COREG) 12.5 MG tablet    Sig: Take 1 tablet (12.5 mg total) by mouth 2 (two) times daily.    Dispense:  60 tablet  Refill:  0    Medications Discontinued During This Encounter  Medication Reason   Omega-3 Fatty Acids (FISH OIL PO)    vitamin B-12 (CYANOCOBALAMIN) 1000 MCG tablet    Bromfenac Sodium (PROLENSA) 0.07 % SOLN    Difluprednate (DUREZOL OP)    hydrochlorothiazide (HYDRODIURIL) 25 MG tablet    ADVAIR DISKUS 250-50 MCG/DOSE AEPB    amLODipine (NORVASC) 5 MG tablet Discontinued by provider   carvedilol (COREG) 6.25 MG tablet Reorder   losartan-hydrochlorothiazide (HYZAAR) 100-12.5 MG tablet      Current Outpatient Medications:    albuterol (VENTOLIN HFA) 108 (90 Base) MCG/ACT inhaler, Inhale into the lungs every 6 (six) hours as needed for wheezing or shortness of breath., Disp: , Rfl:    alendronate (FOSAMAX) 70 MG tablet, Take 70 mg by mouth once a week., Disp: , Rfl: 5   aspirin EC 81 MG tablet, Take 81 mg by mouth daily., Disp: , Rfl:    atorvastatin (LIPITOR) 10 MG tablet, Take 10 mg by mouth daily., Disp: , Rfl:    Calcium Carbonate-Vitamin D (CALTRATE 600+D PO), Take 1 tablet by mouth 2 (two) times a week., Disp: , Rfl:    fluticasone (FLONASE) 50 MCG/ACT nasal spray, Place 1 spray into both nostrils daily., Disp: , Rfl:    losartan (COZAAR) 100 MG tablet, Take 100 mg by mouth daily., Disp: , Rfl:    montelukast (SINGULAIR) 10 MG tablet, Take 10 mg by mouth daily., Disp: , Rfl: 5    multivitamin-iron-minerals-folic acid (CENTRUM) chewable tablet, Chew 1 tablet by mouth every other day., Disp: , Rfl:    sertraline (ZOLOFT) 50 MG tablet, Take 50 mg by mouth daily., Disp: , Rfl:    TRAVATAN Z 0.004 % SOLN ophthalmic solution, Place 1 drop into both eyes at bedtime., Disp: , Rfl: 12   carvedilol (COREG) 12.5 MG tablet, Take 1 tablet (12.5 mg total) by mouth 2 (two) times daily., Disp: 60 tablet, Rfl: 0  Orders Placed This Encounter  Procedures   EKG 12-Lead   ECHOCARDIOGRAM COMPLETE   PCV CAROTID DUPLEX (BILATERAL)    There are no Patient Instructions on file for this visit.   --Continue cardiac medications as reconciled in final medication list. --Return in about 4 weeks (around 07/12/2022) for Follow up cardiac murmur and , Review test results. or sooner if needed. --Continue follow-up with your primary care physician regarding the management of your other chronic comorbid conditions.  Patient's questions and concerns were addressed to her satisfaction. She voices understanding of the instructions provided during this encounter.   This note was created using a voice recognition software as a result there may be grammatical errors inadvertently enclosed that do not reflect the nature of this encounter. Every attempt is made to correct such errors.  Tessa Lerner, [place], [facility]  Pager:  [phone] Office: [phone]

## 2022-06-16 ENCOUNTER — Ambulatory Visit (HOSPITAL_COMMUNITY)
Admission: RE | Admit: 2022-06-16 | Discharge: 2022-06-16 | Disposition: A | Discharge status: Home / Self Care | Payer: Medicare Other | Source: Ambulatory Visit | Attending: Cardiology | Admitting: Cardiology

## 2022-06-16 DIAGNOSIS — I10 Essential (primary) hypertension: Secondary | ICD-10-CM | POA: Diagnosis not present

## 2022-06-16 DIAGNOSIS — R011 Cardiac murmur, unspecified: Secondary | ICD-10-CM | POA: Diagnosis not present

## 2022-06-16 DIAGNOSIS — I081 Rheumatic disorders of both mitral and tricuspid valves: Secondary | ICD-10-CM | POA: Diagnosis not present

## 2022-06-16 DIAGNOSIS — E785 Hyperlipidemia, unspecified: Secondary | ICD-10-CM | POA: Diagnosis not present

## 2022-06-16 LAB — ECHOCARDIOGRAM COMPLETE
AR max vel: 2.29 cm2
AV Area VTI: 2.28 cm2
AV Area mean vel: 2.25 cm2
AV Mean grad: 13.5 mmHg
AV Peak grad: 22.7 mmHg
Ao pk vel: 2.38 m/s
Area-P 1/2: 4.19 cm2
Calc EF: 72 %
MV VTI: 3.97 cm2
S' Lateral: 2.3 cm
Single Plane A2C EF: 75.7 %
Single Plane A4C EF: 71.5 %

## 2022-06-16 NOTE — Progress Notes (Signed)
  Echocardiogram 2D Echocardiogram has been performed.  Isabel Preston 06/16/2022, 3:55 PM

## 2022-06-17 NOTE — Progress Notes (Signed)
Patient has an upcoming appointment for an echocardiogram on the 13th, but she just did her echo at the hospital yesterday. She wants to make sure that she does not need to repeat the echo. Please advise.

## 2022-07-06 NOTE — Progress Notes (Signed)
Called patient no answer.

## 2022-07-15 ENCOUNTER — Ambulatory Visit: Payer: Medicare Other | Admitting: Cardiology

## 2022-07-15 ENCOUNTER — Ambulatory Visit: Payer: Medicare Other

## 2022-07-15 ENCOUNTER — Encounter: Payer: Self-pay | Admitting: Cardiology

## 2022-07-15 VITALS — BP 142/88 | HR 84 | Resp 16 | Ht 64.0 in | Wt 161.0 lb

## 2022-07-15 DIAGNOSIS — R0609 Other forms of dyspnea: Secondary | ICD-10-CM

## 2022-07-15 DIAGNOSIS — I10 Essential (primary) hypertension: Secondary | ICD-10-CM | POA: Diagnosis not present

## 2022-07-15 DIAGNOSIS — R072 Precordial pain: Secondary | ICD-10-CM

## 2022-07-15 DIAGNOSIS — R0989 Other specified symptoms and signs involving the circulatory and respiratory systems: Secondary | ICD-10-CM | POA: Diagnosis not present

## 2022-07-15 DIAGNOSIS — E782 Mixed hyperlipidemia: Secondary | ICD-10-CM | POA: Diagnosis not present

## 2022-07-15 DIAGNOSIS — I350 Nonrheumatic aortic (valve) stenosis: Secondary | ICD-10-CM

## 2022-07-15 MED ORDER — TORSEMIDE 10 MG PO TABS: 10.0000 mg | ORAL_TABLET | Freq: Every day | ORAL | 0 refills | Status: DC

## 2022-07-15 NOTE — Progress Notes (Signed)
ID:  Isabel Preston, DOB 1940/05/11, MRN [id number]  PCP:  Koren Shiver, DO  Cardiologist:  Tessa Lerner, DO, [facility] (established care 06/14/2022)  Date: 07/15/22 Last Office Visit: 06/14/2022  Chief Complaint  Patient presents with   Precordial chest pain   Follow-up    HPI  Isabel Preston is a 82 y.o. Caucasian female whose past medical history and cardiovascular risk factors include: Hypertension, hyperlipidemia, major depressive disorder, asthma, history of glaucoma.   Patient was referred to the practice for evaluation of a cardiac murmur; however, voices chest pain.  She is accompanied by her granddaughter Isabel Preston  [phone]) at today's office visit and patient provides verbal consent with having her present.  On physical examination she had a systolic murmur concerning for aortic stenosis.  Since then she underwent an echocardiogram which notes preserved LVEF and aortic stenosis the hemodynamics/planimetry data is discrepant as outlined below.  However clinically she denies anginal chest pain, heart failure symptoms, near-syncope or syncopal event.  Given her lower extremity swelling amlodipine was discontinued and carvedilol was increased.  Her swelling has improved as result and blood pressure remains relatively stable.  She forgot to bring her blood pressures but states at home SBP is 130 mmHg.  With regards to her precordial discomfort she still has it however the intensity frequency and duration has not worsened.  At last office visit stress test was not scheduled as we were awaiting the results of the echo.  FUNCTIONAL STATUS: Enjoys doing yard work. Does have a stationary bike which she does 15 minutes at a time.  ALLERGIES: Allergies  Allergen Reactions   Codeine Nausea And Vomiting   Iodinated Contrast Media Other (See Comments)    Heart starts racing   Prednisone Nausea And Vomiting   Sulfa Antibiotics Nausea And Vomiting   Vibra-Tab [Doxycycline] Nausea  And Vomiting    MEDICATION LIST PRIOR TO VISIT: Current Meds  Medication Sig   albuterol (VENTOLIN HFA) 108 (90 Base) MCG/ACT inhaler Inhale into the lungs every 6 (six) hours as needed for wheezing or shortness of breath.   alendronate (FOSAMAX) 70 MG tablet Take 70 mg by mouth once a week.   aspirin EC 81 MG tablet Take 81 mg by mouth daily.   atorvastatin (LIPITOR) 10 MG tablet Take 10 mg by mouth daily.   Calcium Carbonate-Vitamin D (CALTRATE 600+D PO) Take 1 tablet by mouth 2 (two) times a week.   carvedilol (COREG) 12.5 MG tablet Take 1 tablet (12.5 mg total) by mouth 2 (two) times daily.   fluticasone (FLONASE) 50 MCG/ACT nasal spray Place 1 spray into both nostrils daily.   losartan (COZAAR) 100 MG tablet Take 100 mg by mouth daily.   montelukast (SINGULAIR) 10 MG tablet Take 10 mg by mouth daily.   multivitamin-iron-minerals-folic acid (CENTRUM) chewable tablet Chew 1 tablet by mouth every other day.   sertraline (ZOLOFT) 50 MG tablet Take 50 mg by mouth daily.   torsemide (DEMADEX) 10 MG tablet Take 1 tablet (10 mg total) by mouth daily for 10 days.   TRAVATAN Z 0.004 % SOLN ophthalmic solution Place 1 drop into both eyes at bedtime.     PAST MEDICAL HISTORY: Past Medical History:  Diagnosis Date   Asthma    Cataract    Depression    Glaucoma    Hypercholesteremia    Hypertension    Skin cancer     PAST SURGICAL HISTORY: Past Surgical History:  Procedure Laterality Date   CATARACT  EXTRACTION W/PHACO Left 07/25/2014   Procedure: CATARACT EXTRACTION PHACO AND INTRAOCULAR LENS PLACEMENT ; CDE:  5.83;  Surgeon: Gemma Payor, MD;  Location: [facility];  Service: Ophthalmology;  Laterality: Left;   CATARACT EXTRACTION W/PHACO Right 08/12/2014   Procedure: CATARACT EXTRACTION PHACO AND INTRAOCULAR LENS PLACEMENT RIGHT EYE;  Surgeon: Gemma Payor, MD;  Location: [facility];  Service: Ophthalmology;  Laterality: Right;  CDE:6.52   SKIN CANCER EXCISION      FAMILY HISTORY: The patient  family history includes Alzheimer's disease in her sister; Arthritis in her sister; Cancer - Lung in her brother, brother, brother, brother, brother, sister, sister, sister, and sister; Heart failure in her mother; Pneumonia in her brother and father.  SOCIAL HISTORY:  The patient  reports that she has never smoked. She does not have any smokeless tobacco history on file. She reports that she does not drink alcohol and does not use drugs.  REVIEW OF SYSTEMS: Review of Systems  Cardiovascular:  Positive for chest pain and dyspnea on exertion. Negative for claudication, irregular heartbeat, leg swelling, near-syncope, orthopnea, palpitations, paroxysmal nocturnal dyspnea and syncope.  Respiratory:  Negative for shortness of breath.   Hematologic/Lymphatic: Negative for bleeding problem.  Musculoskeletal:  Negative for muscle cramps and myalgias.  Neurological:  Negative for dizziness and light-headedness.    PHYSICAL EXAM:    07/15/2022    4:13 PM 07/15/2022    3:28 PM 06/14/2022    1:38 PM  Vitals with BMI  Height  5\' 4"  5\' 4"   Weight  161 lbs 160 lbs 6 oz  BMI  27.62 27.52  Systolic 142 169 409  Diastolic 88 76 83  Pulse  84 88    Physical Exam  Constitutional: No distress.  Age appropriate, hemodynamically stable.   Neck: No JVD present.  Cardiovascular: Normal rate, regular rhythm, S1 normal, S2 normal, intact distal pulses and normal pulses. Exam reveals no gallop, no S3 and no S4.  Murmur heard. Harsh crescendo-decrescendo systolic murmur is present with a grade of 3/6 at the upper right sternal border radiating to the neck. Pulses:      Carotid pulses are  on the right side with bruit and  on the left side with bruit.      Dorsalis pedis pulses are 2+ on the right side and 2+ on the left side.       Posterior tibial pulses are 2+ on the right side and 2+ on the left side.  Pulmonary/Chest: Effort normal and breath sounds normal. No stridor. She has no wheezes. She has no  rales.  Abdominal: Soft. Bowel sounds are normal. She exhibits no distension. There is no abdominal tenderness.  Musculoskeletal:        General: Edema present.     Cervical back: Neck supple.  Neurological: She is alert and oriented to person, place, and time. She has intact cranial nerves (2-12).  Skin: Skin is warm and moist.   CARDIAC DATABASE: EKG: Jun 14, 2022: Sinus rhythm, 81 bpm, without underlying ischemia injury pattern.  Echocardiogram: 06/16/2022 1. Left ventricular ejection fraction, by estimation, is 65 to 70%. The left ventricle has normal function. The left ventricle has no regional wall motion abnormalities. Left ventricular diastolic parameters were normal. The average left ventricular  global longitudinal strain is -24.0 %. 2. Right ventricular systolic function is normal. The right ventricular size is normal. There is mildly elevated pulmonary artery systolic pressure. 3. Right atrial size was mildly dilated. 4. The mitral valve is degenerative.  Mild mitral valve regurgitation. No evidence of mitral stenosis. 5. Tricuspid valve regurgitation is moderate. 6. The aortic valve is tricuspid. Aortic valve regurgitation is trivial. Aortic valve sclerosis with degree of aortic valve stenosis (peak velocity 2.47ms/, MG 13.50mmHG, DI 0.57, valve planimetry 0.943cm2 (discordant to the other hemodynamic  parameters)). 7. The inferior vena cava is normal in size with greater than 50% respiratory variability, suggesting right atrial pressure of 3 mmHg. 8. Rhythm strip during this exam demonstrates normal sinus rhythm     Stress Testing: No results found for this or any previous visit from the past 1095 days.   Heart Catheterization: None  LABORATORY DATA: External Labs: Collected: April 13, 2022 provided by PCP. BUN 13, creatinine 0.71. eGFR 85. Sodium 131, potassium 4.6, chloride 95, bicarb 31 Total cholesterol 180, triglycerides 61, HDL 87, calculated LDL 81, non-HDL  93   IMPRESSION:    ICD-10-CM   1. Precordial chest pain  R07.2 PCV MYOCARDIAL PERFUSION WITH LEXISCAN    2. Dyspnea on exertion  R06.09 torsemide (DEMADEX) 10 MG tablet    Basic metabolic panel    3. Nonrheumatic aortic valve stenosis  I35.0 PCV ECHOCARDIOGRAM COMPLETE    4. Benign hypertension  I10     5. Mixed hyperlipidemia  E78.2        RECOMMENDATIONS: AMIELIA RAMSON is a 82 y.o. Caucasian female whose past medical history and cardiac risk factors include: Hypertension, hyperlipidemia, major depressive disorder, asthma, history of glaucoma.   Precordial chest pain Symptoms suggestive of noncardiac discomfort. Prior EKG nonischemic. Echocardiogram: Preserved LVEF, normal diastolic function, aortic stenosis. EKG is interpretable patient is unable to exercise therefore request pharmacological stress to evaluate for reversible ischemia.  Risks, benefits, and alternatives discussed.  Dyspnea on exertion Improving. Ischemic workup as outlined above. Echocardiogram results reviewed. Likely secondary to uncontrolled hypertension and valvular heart disease. Start torsemide 10 mg p.o. daily. Labs in 1 week to evaluate kidney function and electrolytes  Nonrheumatic aortic valve stenosis Echo May 2024: Mean gradient <20 mmHg, dimensionless index 0.57, valve planimetry 0.94 cm. Given the new diagnosis of aortic stenosis recommend an echocardiogram in 6 months to reevaluate disease progression. Patient is asked to be cognizant of symptoms of chest pain/angina pectoris, heart failure symptoms, syncope.  If present she needs to go to the closest ER via EMS for further evaluation and management Reemphasized importance of blood pressure management and avoiding dehydration.  Benign hypertension Office blood pressures are not at goal but improving. Continue current medical therapy. Added torsemide as noted above  Mixed hyperlipidemia Currently on atorvastatin. Most recent lipids  from March 2024 independently reviewed. Currently managed by primary care provider.  FINAL MEDICATION LIST END OF ENCOUNTER: Meds ordered this encounter  Medications   torsemide (DEMADEX) 10 MG tablet    Sig: Take 1 tablet (10 mg total) by mouth daily for 10 days.    Dispense:  30 tablet    Refill:  0    There are no discontinued medications.    Current Outpatient Medications:    albuterol (VENTOLIN HFA) 108 (90 Base) MCG/ACT inhaler, Inhale into the lungs every 6 (six) hours as needed for wheezing or shortness of breath., Disp: , Rfl:    alendronate (FOSAMAX) 70 MG tablet, Take 70 mg by mouth once a week., Disp: , Rfl: 5   aspirin EC 81 MG tablet, Take 81 mg by mouth daily., Disp: , Rfl:    atorvastatin (LIPITOR) 10 MG tablet, Take 10 mg by mouth daily., Disp: ,  Rfl:    Calcium Carbonate-Vitamin D (CALTRATE 600+D PO), Take 1 tablet by mouth 2 (two) times a week., Disp: , Rfl:    carvedilol (COREG) 12.5 MG tablet, Take 1 tablet (12.5 mg total) by mouth 2 (two) times daily., Disp: 180 tablet, Rfl: 0   fluticasone (FLONASE) 50 MCG/ACT nasal spray, Place 1 spray into both nostrils daily., Disp: , Rfl:    losartan (COZAAR) 100 MG tablet, Take 100 mg by mouth daily., Disp: , Rfl:    montelukast (SINGULAIR) 10 MG tablet, Take 10 mg by mouth daily., Disp: , Rfl: 5   multivitamin-iron-minerals-folic acid (CENTRUM) chewable tablet, Chew 1 tablet by mouth every other day., Disp: , Rfl:    sertraline (ZOLOFT) 50 MG tablet, Take 50 mg by mouth daily., Disp: , Rfl:    torsemide (DEMADEX) 10 MG tablet, Take 1 tablet (10 mg total) by mouth daily for 10 days., Disp: 30 tablet, Rfl: 0   TRAVATAN Z 0.004 % SOLN ophthalmic solution, Place 1 drop into both eyes at bedtime., Disp: , Rfl: 12  Orders Placed This Encounter  Procedures   Basic metabolic panel   PCV MYOCARDIAL PERFUSION WITH LEXISCAN   PCV ECHOCARDIOGRAM COMPLETE    There are no Patient Instructions on file for this visit.   --Continue  cardiac medications as reconciled in final medication list. --Return in about 3 months (around 10/15/2022) for Follow up, Dyspnea, Aortic stenosis, Review test results. or sooner if needed. --Continue follow-up with your primary care physician regarding the management of your other chronic comorbid conditions.  Patient's questions and concerns were addressed to her satisfaction. She voices understanding of the instructions provided during this encounter.   This note was created using a voice recognition software as a result there may be grammatical errors inadvertently enclosed that do not reflect the nature of this encounter. Every attempt is made to correct such errors.  Tessa Lerner, [place], [facility]  Pager:  [phone] Office: [phone]

## 2022-07-16 ENCOUNTER — Other Ambulatory Visit: Payer: Self-pay

## 2022-07-16 DIAGNOSIS — R0609 Other forms of dyspnea: Secondary | ICD-10-CM

## 2022-07-23 ENCOUNTER — Ambulatory Visit: Payer: Medicare Other | Admitting: Cardiology

## 2022-07-30 NOTE — Progress Notes (Signed)
Called patient, NA, LMAM

## 2022-08-02 ENCOUNTER — Telehealth: Payer: Self-pay

## 2022-08-02 NOTE — Telephone Encounter (Signed)
Feet and ankles swelling. Can she start taking the Torsemide again? She took it for 10 days and it helped with the swelling.

## 2022-08-03 NOTE — Telephone Encounter (Signed)
Yes, can take torsemide. But she was suppose to have BMP after being on it for one week. Please find out.   Sueann Brownley [place], DO, [facility]

## 2022-08-03 NOTE — Telephone Encounter (Signed)
PT AWARE  

## 2022-08-09 ENCOUNTER — Ambulatory Visit: Payer: Medicare Other

## 2022-08-09 DIAGNOSIS — R072 Precordial pain: Secondary | ICD-10-CM

## 2022-08-09 DIAGNOSIS — R0609 Other forms of dyspnea: Secondary | ICD-10-CM | POA: Diagnosis not present

## 2022-08-09 LAB — PCV MYOCARDIAL PERFUSION WITH LEXISCAN: ST Elevation (mm): 1 mm

## 2022-08-10 LAB — BASIC METABOLIC PANEL
BUN/Creatinine Ratio: 9 — ABNORMAL LOW (ref 12–28)
BUN: 7 mg/dL — ABNORMAL LOW (ref 8–27)
CO2: 26 mmol/L (ref 20–29)
Calcium: 10 mg/dL (ref 8.7–10.3)
Chloride: 89 mmol/L — ABNORMAL LOW (ref 96–106)
Creatinine, Ser: 0.79 mg/dL (ref 0.57–1.00)
Glucose: 99 mg/dL (ref 70–99)
Potassium: 4 mmol/L (ref 3.5–5.2)
Sodium: 129 mmol/L — ABNORMAL LOW (ref 134–144)
eGFR: 75 mL/min/{1.73_m2} (ref >59)

## 2022-08-12 NOTE — Progress Notes (Signed)
Called and spoke with patient regarding her CAD results.

## 2022-08-16 ENCOUNTER — Telehealth: Payer: Self-pay

## 2022-08-16 NOTE — Telephone Encounter (Signed)
Pts grand daughter called for lab results still take torsemide 366 [phone]

## 2022-08-16 NOTE — Telephone Encounter (Signed)
Called her grand-daughter - had to leave a voicemail.   Warnell Rasnic [place], DO, [facility]

## 2022-08-17 NOTE — Telephone Encounter (Signed)
error 

## 2022-08-18 ENCOUNTER — Telehealth: Payer: Self-pay

## 2022-08-18 NOTE — Telephone Encounter (Signed)
Grandaughter Isabel Preston calling for lab results. Please advise//ah

## 2022-08-19 NOTE — Telephone Encounter (Signed)
We have called several times no one answers and no one calls back

## 2022-08-26 ENCOUNTER — Other Ambulatory Visit: Payer: Self-pay | Admitting: Cardiology

## 2022-08-26 DIAGNOSIS — I350 Nonrheumatic aortic (valve) stenosis: Secondary | ICD-10-CM

## 2022-08-26 DIAGNOSIS — E871 Hypo-osmolality and hyponatremia: Secondary | ICD-10-CM

## 2022-08-26 DIAGNOSIS — R0609 Other forms of dyspnea: Secondary | ICD-10-CM

## 2022-08-26 MED ORDER — TORSEMIDE 10 MG PO TABS: 10.0000 mg | ORAL_TABLET | Freq: Every day | ORAL | 0 refills | Status: DC | PRN

## 2022-08-26 NOTE — Progress Notes (Signed)
ON-CALL CARDIOLOGY 08/26/22  Patient's name: Isabel Preston.   MRN: [id number].    DOB: [birth year] Primary care provider: Koren Shiver, DO. Primary cardiologist: Tessa Lerner, DO, [facility]  Interaction regarding this patient's care today: Spoke to her Bjorn Loser who usually accompanies her at the office visits  Most recent labs reviewed.     Latest Ref Rng & Units 08/09/2022    2:12 PM 07/22/2014    2:00 PM  BMP  Glucose 70 - 99 mg/dL 99  98   BUN 8 - 27 mg/dL 7  13   Creatinine 8.75 - 1.00 mg/dL 6.43  3.29   BUN/Creat Ratio 12 - 28 9    Sodium 134 - 144 mmol/L 129  127   Potassium 3.5 - 5.2 mmol/L 4.0  4.0   Chloride 96 - 106 mmol/L 89  90   CO2 20 - 29 mmol/L 26  31   Calcium 8.7 - 10.3 mg/dL 51.8  9.3    She chronically has hyponatremia I do not think is secondary to torsemide.  However with torsemide she has lost 10-15 pounds of weight and her home blood pressures are around 120 mmHg according to [place].  Patient no longer experiences chest pain or shortness of breath.  Reviewed the most recent stress test results with her in detail.  Shared decision was to continue her current blood pressure medications.  Will send in a prescription for torsemide 10 mg p.o. as needed daily for weight gain more than 1 pound over 24 hours or 3 pounds over a week.  If she has chest pain or worsening shortness of breath she is advised to go to the closest ER via EMS for further evaluation and management.  Repeat blood work after being off of torsemide to see if her sodium improves.   Impression:   ICD-10-CM   1. Dyspnea on exertion  R06.09 Basic metabolic panel    torsemide (DEMADEX) 10 MG tablet    Basic metabolic panel    2. Nonrheumatic aortic valve stenosis  I35.0     3. Chronic hyponatremia  E87.1 Basic metabolic panel    Basic metabolic panel      Meds ordered this encounter  Medications   torsemide (DEMADEX) 10 MG tablet    Sig: Take 1 tablet (10 mg total) by mouth daily as  needed (If weight increases by 1 pound over 24 hours or 3 pounds over a week.).    Dispense:  30 tablet    Refill:  0    Orders Placed This Encounter  Procedures   Basic metabolic panel    Recommendations: Repeat labs to reevaluate sodium as discussed above. Prescription for torsemide sent to [facility]-to use on a as needed basis Continue to check daily weights and blood pressures. Seek attention if she has worsening shortness of breath or reoccurrence of chest pain.  Telephone encounter total time: 10 minutes  Delilah Shan [facility]  Pager:  [phone] Office: [phone]

## 2022-09-07 DIAGNOSIS — R0609 Other forms of dyspnea: Secondary | ICD-10-CM | POA: Diagnosis not present

## 2022-09-07 DIAGNOSIS — E871 Hypo-osmolality and hyponatremia: Secondary | ICD-10-CM | POA: Diagnosis not present

## 2022-09-07 DIAGNOSIS — H401132 Primary open-angle glaucoma, bilateral, moderate stage: Secondary | ICD-10-CM | POA: Diagnosis not present

## 2022-09-08 DIAGNOSIS — J069 Acute upper respiratory infection, unspecified: Secondary | ICD-10-CM | POA: Diagnosis not present

## 2022-09-08 NOTE — Progress Notes (Signed)
Called and spoke with [facility] regarding patients lab results.

## 2022-09-14 DIAGNOSIS — D485 Neoplasm of uncertain behavior of skin: Secondary | ICD-10-CM | POA: Diagnosis not present

## 2022-09-14 DIAGNOSIS — C44729 Squamous cell carcinoma of skin of left lower limb, including hip: Secondary | ICD-10-CM | POA: Diagnosis not present

## 2022-09-20 ENCOUNTER — Other Ambulatory Visit: Payer: Self-pay | Admitting: Cardiology

## 2022-09-20 DIAGNOSIS — I10 Essential (primary) hypertension: Secondary | ICD-10-CM

## 2022-09-20 DIAGNOSIS — R072 Precordial pain: Secondary | ICD-10-CM

## 2022-09-27 DIAGNOSIS — C44729 Squamous cell carcinoma of skin of left lower limb, including hip: Secondary | ICD-10-CM | POA: Diagnosis not present

## 2022-10-05 DIAGNOSIS — C44729 Squamous cell carcinoma of skin of left lower limb, including hip: Secondary | ICD-10-CM | POA: Diagnosis not present

## 2022-10-14 DIAGNOSIS — C44729 Squamous cell carcinoma of skin of left lower limb, including hip: Secondary | ICD-10-CM | POA: Diagnosis not present

## 2022-10-15 ENCOUNTER — Ambulatory Visit: Payer: Medicare Other | Admitting: Cardiology

## 2022-10-15 ENCOUNTER — Encounter: Payer: Self-pay | Admitting: Cardiology

## 2022-10-15 VITALS — BP 143/80 | HR 88 | Resp 16 | Ht 64.0 in | Wt 152.0 lb

## 2022-10-15 DIAGNOSIS — E781 Pure hyperglyceridemia: Secondary | ICD-10-CM | POA: Diagnosis not present

## 2022-10-15 DIAGNOSIS — E782 Mixed hyperlipidemia: Secondary | ICD-10-CM | POA: Diagnosis not present

## 2022-10-15 DIAGNOSIS — I10 Essential (primary) hypertension: Secondary | ICD-10-CM | POA: Diagnosis not present

## 2022-10-15 DIAGNOSIS — I350 Nonrheumatic aortic (valve) stenosis: Secondary | ICD-10-CM

## 2022-10-15 DIAGNOSIS — E871 Hypo-osmolality and hyponatremia: Secondary | ICD-10-CM

## 2022-10-15 DIAGNOSIS — R0609 Other forms of dyspnea: Secondary | ICD-10-CM | POA: Diagnosis not present

## 2022-10-15 NOTE — Progress Notes (Signed)
ID:  Isabel Preston, DOB May 19, 1940, MRN [id number]  PCP:  Koren Shiver, DO  Cardiologist:  Tessa Lerner, DO, [facility] (established care 06/14/2022)  Date: 10/15/22 Last Office Visit: 07/15/2022  Chief Complaint  Patient presents with   Follow-up    Aortic valve disease, shortness of breath, discuss test results    HPI  Isabel Preston is a 82 y.o. Caucasian female whose past medical history and cardiovascular risk factors include: Aortic stenosis, hypertension, hyperlipidemia, major depressive disorder, asthma, history of glaucoma.   She is accompanied by her daughter Isabel Preston at today's office visit and patient provides verbal consent with having her present.  Patient was referred to the practice for evaluation of cardiac murmur.  Physical examination findings concerning for aortic stenosis.  Echocardiogram noted preserved LVEF and aortic stenosis.  There is discordant findings with regards to the severity of aortic stenosis based on hemodynamics and planimetry.  Aortic valve area by planimetry is 0.94 cm and mean gradient is 13.5 mmHG and dimensional index 0.57.  She also endorsed precordial discomfort and underwent stress testing.  Stress ECG was equivocal but normal myocardial perfusion.  Patient presents today for follow-up.  Patient denies anginal chest pain.  Her home blood pressures are consistently <132 mmHg.  Recently has been experiencing productive cough, expiratory wheezing which is audible, and has been evaluated by PCP according to her and has viral URI.  FUNCTIONAL STATUS: Enjoys doing yard work. Does have a stationary bike which she does 15 minutes at a time.  ALLERGIES: Allergies  Allergen Reactions   Codeine Nausea And Vomiting   Iodinated Contrast Media Other (See Comments)    Heart starts racing   Prednisone Nausea And Vomiting   Sulfa Antibiotics Nausea And Vomiting   Vibra-Tab [Doxycycline] Nausea And Vomiting    MEDICATION LIST PRIOR TO VISIT: Current  Meds  Medication Sig   albuterol (VENTOLIN HFA) 108 (90 Base) MCG/ACT inhaler Inhale into the lungs every 6 (six) hours as needed for wheezing or shortness of breath.   alendronate (FOSAMAX) 70 MG tablet Take 70 mg by mouth once a week.   aspirin EC 81 MG tablet Take 81 mg by mouth daily.   atorvastatin (LIPITOR) 10 MG tablet Take 10 mg by mouth daily.   Calcium Carbonate-Vitamin D (CALTRATE 600+D PO) Take 1 tablet by mouth 2 (two) times a week.   carvedilol (COREG) 12.5 MG tablet TAKE ONE TABLET BY MOUTH TWICE DAILY (STOP amlodipine)   fluticasone (FLONASE) 50 MCG/ACT nasal spray Place 1 spray into both nostrils daily.   losartan (COZAAR) 100 MG tablet Take 100 mg by mouth daily.   montelukast (SINGULAIR) 10 MG tablet Take 10 mg by mouth daily.   multivitamin-iron-minerals-folic acid (CENTRUM) chewable tablet Chew 1 tablet by mouth every other day.   sertraline (ZOLOFT) 50 MG tablet Take 50 mg by mouth daily.   TRAVATAN Z 0.004 % SOLN ophthalmic solution Place 1 drop into both eyes at bedtime.     PAST MEDICAL HISTORY: Past Medical History:  Diagnosis Date   Asthma    Cataract    Depression    Glaucoma    Hypercholesteremia    Hypertension    Skin cancer     PAST SURGICAL HISTORY: Past Surgical History:  Procedure Laterality Date   CATARACT EXTRACTION W/PHACO Left 07/25/2014   Procedure: CATARACT EXTRACTION PHACO AND INTRAOCULAR LENS PLACEMENT ; CDE:  5.83;  Surgeon: Gemma Payor, MD;  Location: [facility];  Service: Ophthalmology;  Laterality: Left;  CATARACT EXTRACTION W/PHACO Right 08/12/2014   Procedure: CATARACT EXTRACTION PHACO AND INTRAOCULAR LENS PLACEMENT RIGHT EYE;  Surgeon: Gemma Payor, MD;  Location: [facility];  Service: Ophthalmology;  Laterality: Right;  CDE:6.52   SKIN CANCER EXCISION      FAMILY HISTORY: The patient family history includes Alzheimer's disease in her sister; Arthritis in her sister; Cancer - Lung in her brother, brother, brother, brother, brother, sister,  sister, sister, and sister; Heart failure in her mother; Pneumonia in her brother and father.  SOCIAL HISTORY:  The patient  reports that she has never smoked. She does not have any smokeless tobacco history on file. She reports that she does not drink alcohol and does not use drugs.  REVIEW OF SYSTEMS: Review of Systems  Cardiovascular:  Positive for dyspnea on exertion. Negative for chest pain, claudication, irregular heartbeat, leg swelling, near-syncope, orthopnea, palpitations, paroxysmal nocturnal dyspnea and syncope.  Respiratory:  Positive for cough and wheezing. Negative for shortness of breath.   Hematologic/Lymphatic: Negative for bleeding problem.  Musculoskeletal:  Negative for muscle cramps and myalgias.  Neurological:  Negative for dizziness and light-headedness.    PHYSICAL EXAM:    10/15/2022   12:52 PM 10/15/2022   12:39 PM 10/15/2022   12:07 PM  Vitals with BMI  Height   5\' 4"   Weight   152 lbs  BMI   26.08  Systolic 143 166 381  Diastolic 80 72 73  Pulse 88 70 72    Physical Exam  Constitutional: No distress.  Age appropriate, hemodynamically stable.   Neck: No JVD present.  Cardiovascular: Normal rate, regular rhythm, S1 normal, S2 normal, intact distal pulses and normal pulses. Exam reveals no gallop, no S3 and no S4.  Murmur heard. Harsh crescendo-decrescendo systolic murmur is present with a grade of 3/6 at the upper right sternal border radiating to the neck. Pulses:      Carotid pulses are  on the right side with bruit and  on the left side with bruit.      Dorsalis pedis pulses are 2+ on the right side and 2+ on the left side.       Posterior tibial pulses are 2+ on the right side and 2+ on the left side.  Pulmonary/Chest: Effort normal. No stridor. She has no rales. She has diffuse wheezes.  Bilateral rhonchi's.  Abdominal: Soft. Bowel sounds are normal. She exhibits no distension. There is no abdominal tenderness.  Musculoskeletal:        General:  Edema present.     Cervical back: Neck supple.  Neurological: She is alert and oriented to person, place, and time. She has intact cranial nerves (2-12).  Skin: Skin is warm and moist.   CARDIAC DATABASE: EKG: Jun 14, 2022: Sinus rhythm, 81 bpm, without underlying ischemia injury pattern.  Echocardiogram: 06/16/2022 1. Left ventricular ejection fraction, by estimation, is 65 to 70%. The left ventricle has normal function. The left ventricle has no regional wall motion abnormalities. Left ventricular diastolic parameters were normal. The average left ventricular  global longitudinal strain is -24.0 %. 2. Right ventricular systolic function is normal. The right ventricular size is normal. There is mildly elevated pulmonary artery systolic pressure. 3. Right atrial size was mildly dilated. 4. The mitral valve is degenerative. Mild mitral valve regurgitation. No evidence of mitral stenosis. 5. Tricuspid valve regurgitation is moderate. 6. The aortic valve is tricuspid. Aortic valve regurgitation is trivial. Aortic valve sclerosis with degree of aortic valve stenosis (peak velocity 2.18ms/, MG  13.57mmHG, DI 0.57, valve planimetry 0.943cm2 (discordant to the other hemodynamic  parameters)). 7. The inferior vena cava is normal in size with greater than 50% respiratory variability, suggesting right atrial pressure of 3 mmHg. 8. Rhythm strip during this exam demonstrates normal sinus rhythm     Stress Testing: No results found for this or any previous visit from the past 1095 days.   Heart Catheterization: None  LABORATORY DATA: External Labs: Collected: April 13, 2022 provided by PCP. BUN 13, creatinine 0.71. eGFR 85. Sodium 131, potassium 4.6, chloride 95, bicarb 31 Total cholesterol 180, triglycerides 61, HDL 87, calculated LDL 81, non-HDL 93   IMPRESSION:    ICD-10-CM   1. Nonrheumatic aortic valve stenosis  I35.0     2. Dyspnea on exertion  R06.09     3. Chronic hyponatremia   E87.1     4. Benign hypertension  I10     5. Mixed hyperlipidemia  E78.2         RECOMMENDATIONS: RORIE YUEN is a 82 y.o. Caucasian female whose past medical history and cardiac risk factors include: Hypertension, hyperlipidemia, major depressive disorder, asthma, history of glaucoma.   Nonrheumatic aortic valve stenosis Echo May 2024: Mean gradient <20 mmHg, dimensionless index 0.57, valve planimetry 0.94 cm. Plan echocardiogram as a 49-month follow-up visit in December 2024. Patient is aware to seek medical attention if she has anginal chest pain, heart failure symptoms, or syncope  Dyspnea on exertion Multifactorial. MPI since last office visit was performed which notes normal myocardial perfusion. Echocardiogram: Preserved LVEF with valvular heart disease, see report for additional details. Blood pressures in the office are not well-controlled-Home blood pressures are better according to the patient. Her recent exacerbation of dyspnea is likely secondary to URI-on physical examination she has rhonchi's and wheezing.  Chronic hyponatremia According to her granddaughter Isabel Preston she has known history of hyponatremia.  Benign hypertension Blood pressures are acceptable limits. Home blood pressures are better controlled. On the stress that she was noted to have hypertensive response -therefore recommended better blood pressure monitoring at home to see if further medication titration is warranted. Reemphasized the importance of a low-salt diet  Mixed hyperlipidemia Currently on atorvastatin. LDL within acceptable limits. Currently managed by primary care provider.  FINAL MEDICATION LIST END OF ENCOUNTER: No orders of the defined types were placed in this encounter.   There are no discontinued medications.    Current Outpatient Medications:    albuterol (VENTOLIN HFA) 108 (90 Base) MCG/ACT inhaler, Inhale into the lungs every 6 (six) hours as needed for wheezing or  shortness of breath., Disp: , Rfl:    alendronate (FOSAMAX) 70 MG tablet, Take 70 mg by mouth once a week., Disp: , Rfl: 5   aspirin EC 81 MG tablet, Take 81 mg by mouth daily., Disp: , Rfl:    atorvastatin (LIPITOR) 10 MG tablet, Take 10 mg by mouth daily., Disp: , Rfl:    Calcium Carbonate-Vitamin D (CALTRATE 600+D PO), Take 1 tablet by mouth 2 (two) times a week., Disp: , Rfl:    carvedilol (COREG) 12.5 MG tablet, TAKE ONE TABLET BY MOUTH TWICE DAILY (STOP amlodipine), Disp: 180 tablet, Rfl: 0   fluticasone (FLONASE) 50 MCG/ACT nasal spray, Place 1 spray into both nostrils daily., Disp: , Rfl:    losartan (COZAAR) 100 MG tablet, Take 100 mg by mouth daily., Disp: , Rfl:    montelukast (SINGULAIR) 10 MG tablet, Take 10 mg by mouth daily., Disp: , Rfl: 5  multivitamin-iron-minerals-folic acid (CENTRUM) chewable tablet, Chew 1 tablet by mouth every other day., Disp: , Rfl:    sertraline (ZOLOFT) 50 MG tablet, Take 50 mg by mouth daily., Disp: , Rfl:    TRAVATAN Z 0.004 % SOLN ophthalmic solution, Place 1 drop into both eyes at bedtime., Disp: , Rfl: 12   torsemide (DEMADEX) 10 MG tablet, Take 1 tablet (10 mg total) by mouth daily as needed (If weight increases by 1 pound over 24 hours or 3 pounds over a week.)., Disp: 30 tablet, Rfl: 0  No orders of the defined types were placed in this encounter.   There are no Patient Instructions on file for this visit.   --Continue cardiac medications as reconciled in final medication list. --Return in about 4 months (around 02/14/2023) for Follow up aortic stenosis s/p echo . or sooner if needed. --Continue follow-up with your primary care physician regarding the management of your other chronic comorbid conditions.  Patient's questions and concerns were addressed to her satisfaction. She voices understanding of the instructions provided during this encounter.   This note was created using a voice recognition software as a result there may be  grammatical errors inadvertently enclosed that do not reflect the nature of this encounter. Every attempt is made to correct such errors.  Tessa Lerner, [place], [facility]  Pager:  [phone] Office: [phone]

## 2022-10-18 DIAGNOSIS — E871 Hypo-osmolality and hyponatremia: Secondary | ICD-10-CM | POA: Diagnosis not present

## 2022-10-18 DIAGNOSIS — I10 Essential (primary) hypertension: Secondary | ICD-10-CM | POA: Diagnosis not present

## 2022-10-18 DIAGNOSIS — E782 Mixed hyperlipidemia: Secondary | ICD-10-CM | POA: Diagnosis not present

## 2022-10-18 DIAGNOSIS — I350 Nonrheumatic aortic (valve) stenosis: Secondary | ICD-10-CM | POA: Diagnosis not present

## 2022-10-18 DIAGNOSIS — R0609 Other forms of dyspnea: Secondary | ICD-10-CM | POA: Diagnosis not present

## 2022-11-01 ENCOUNTER — Other Ambulatory Visit: Payer: Self-pay | Admitting: Cardiology

## 2022-11-01 DIAGNOSIS — I10 Essential (primary) hypertension: Secondary | ICD-10-CM

## 2022-11-01 DIAGNOSIS — R072 Precordial pain: Secondary | ICD-10-CM

## 2022-11-15 DIAGNOSIS — S81802A Unspecified open wound, left lower leg, initial encounter: Secondary | ICD-10-CM | POA: Diagnosis not present

## 2022-12-02 DIAGNOSIS — C44629 Squamous cell carcinoma of skin of left upper limb, including shoulder: Secondary | ICD-10-CM | POA: Diagnosis not present

## 2023-01-14 ENCOUNTER — Other Ambulatory Visit: Payer: Medicare Other

## 2023-01-14 ENCOUNTER — Ambulatory Visit (HOSPITAL_COMMUNITY): Payer: Medicare Other

## 2023-03-21 DIAGNOSIS — H353131 Nonexudative age-related macular degeneration, bilateral, early dry stage: Secondary | ICD-10-CM | POA: Diagnosis not present

## 2023-04-25 DIAGNOSIS — Z0000 Encounter for general adult medical examination without abnormal findings: Secondary | ICD-10-CM | POA: Diagnosis not present

## 2023-04-25 DIAGNOSIS — I10 Essential (primary) hypertension: Secondary | ICD-10-CM | POA: Diagnosis not present

## 2023-04-25 DIAGNOSIS — M810 Age-related osteoporosis without current pathological fracture: Secondary | ICD-10-CM | POA: Diagnosis not present

## 2023-04-25 DIAGNOSIS — J4540 Moderate persistent asthma, uncomplicated: Secondary | ICD-10-CM | POA: Diagnosis not present

## 2023-04-25 DIAGNOSIS — E782 Mixed hyperlipidemia: Secondary | ICD-10-CM | POA: Diagnosis not present

## 2023-04-25 DIAGNOSIS — Z23 Encounter for immunization: Secondary | ICD-10-CM | POA: Diagnosis not present

## 2023-04-25 DIAGNOSIS — J309 Allergic rhinitis, unspecified: Secondary | ICD-10-CM | POA: Diagnosis not present

## 2023-05-16 ENCOUNTER — Other Ambulatory Visit: Payer: Self-pay | Admitting: Cardiology

## 2023-05-16 DIAGNOSIS — R0609 Other forms of dyspnea: Secondary | ICD-10-CM

## 2023-05-23 ENCOUNTER — Ambulatory Visit: Attending: Cardiology | Admitting: Cardiology

## 2023-05-23 ENCOUNTER — Encounter: Payer: Self-pay | Admitting: Cardiology

## 2023-05-23 VITALS — BP 140/80 | HR 79 | Resp 16 | Ht 64.0 in | Wt 154.2 lb

## 2023-05-23 DIAGNOSIS — I739 Peripheral vascular disease, unspecified: Secondary | ICD-10-CM

## 2023-05-23 DIAGNOSIS — I350 Nonrheumatic aortic (valve) stenosis: Secondary | ICD-10-CM

## 2023-05-23 DIAGNOSIS — I10 Essential (primary) hypertension: Secondary | ICD-10-CM | POA: Diagnosis not present

## 2023-05-23 MED ORDER — CARVEDILOL 25 MG PO TABS: 25.0000 mg | ORAL_TABLET | Freq: Two times a day (BID) | ORAL | 3 refills | Status: DC

## 2023-05-23 NOTE — Patient Instructions (Signed)
 Medication Instructions:  Your physician has recommended you make the following change in your medication:   INCREASE Carvedilol  to 25 mg twice daily  HOLD if systolic blood pressure (top number) is less than 100 and/ or heart rate is less than 55.    *If you need a refill on your cardiac medications before your next appointment, please call your pharmacy*  Lab Work: None ordered today. If you have labs (blood work) drawn today and your tests are completely normal, you will receive your results only by: MyChart Message (if you have MyChart) OR A paper copy in the mail If you have any lab test that is abnormal or we need to change your treatment, we will call you to review the results.  Testing/Procedures: Your physician has requested that you have an ankle brachial index (ABI). During this test an ultrasound and blood pressure cuff are used to evaluate the arteries that supply the arms and legs with blood. Allow thirty minutes for this exam. There are no restrictions or special instructions.  Please note: We ask at that you not bring children with you during ultrasound (echo/ vascular) testing. Due to room size and safety concerns, children are not allowed in the ultrasound rooms during exams. Our front office staff cannot provide observation of children in our lobby area while testing is being conducted. An adult accompanying a patient to their appointment will only be allowed in the ultrasound room at the discretion of the ultrasound technician under special circumstances. We apologize for any inconvenience.   Your physician has requested that you have an echocardiogram in May 2025 (previous order in system). Echocardiography is a painless test that uses sound waves to create images of your heart. It provides your doctor with information about the size and shape of your heart and how well your heart's chambers and valves are working. This procedure takes approximately one hour. There are no  restrictions for this procedure. Please do NOT wear cologne, perfume, aftershave, or lotions (deodorant is allowed). Please arrive 15 minutes prior to your appointment time.  Please note: We ask at that you not bring children with you during ultrasound (echo/ vascular) testing. Due to room size and safety concerns, children are not allowed in the ultrasound rooms during exams. Our front office staff cannot provide observation of children in our lobby area while testing is being conducted. An adult accompanying a patient to their appointment will only be allowed in the ultrasound room at the discretion of the ultrasound technician under special circumstances. We apologize for any inconvenience.   Follow-Up: At [facility], you and your health needs are our priority.  As part of our continuing mission to provide you with exceptional heart care, we have created designated Provider Care Teams.  These Care Teams include your primary Cardiologist (physician) and Advanced Practice Providers (APPs -  Physician Assistants and Nurse Practitioners) who all work together to provide you with the care you need, when you need it.  We recommend signing up for the patient portal called "MyChart".  Sign up information is provided on this After Visit Summary.  MyChart is used to connect with patients for Virtual Visits (Telemedicine).  Patients are able to view lab/test results, encounter notes, upcoming appointments, etc.  Non-urgent messages can be sent to your provider as well.   To learn more about what you can do with MyChart, go to [email or web address].    Your next appointment:   6 month(s)  The format for your next appointment:  In Person  Provider:   Olinda Bertrand, DO{  Other Instructions    1st Floor: - Lobby - Registration  - Pharmacy  - Lab - Cafe  2nd Floor: - PV Lab - Diagnostic Testing (echo, CT, nuclear med)  3rd Floor: - Vacant  4th Floor: - TCTS (cardiothoracic surgery) -  [facility] - Structural Heart Clinic - Vascular Surgery  - Vascular Ultrasound  5th Floor: - [facility] (general and EP) - Clinical Pharmacy for coumadin, hypertension, lipid, weight-loss medications, and med management appointments    Valet parking services will be available as well.

## 2023-05-23 NOTE — Progress Notes (Signed)
 Cardiology Office Note:  .   Date:  05/23/2023  ID:  Clearance Cure, DOB 03-May-1940, MRN [id number] PCP:  Jinger Mount, DO (Inactive)  Former Cardiology Providers: None [facility] Providers Cardiologist:  Olinda Bertrand, DO , [facility] (established care 06/14/2022 ) Electrophysiologist:  None  Click to update primary MD,subspecialty MD or APP then REFRESH:1}    Chief Complaint  Patient presents with   Nonrheumatic aortic valve stenosis   Follow-up    History of Present Illness: Isabel Preston   Isabel Preston is a 83 y.o. Caucasian female whose past medical history and cardiovascular risk factors includes: Aortic stenosis, hypertension, hyperlipidemia, major depressive disorder, asthma, history of glaucoma.   She is accompanied by her daughter and great grandson at today's office visit and patient provides verbal consent with having her present.   Patient was referred to the practice for evaluation of cardiac murmur.  Physical examination findings concerning for aortic stenosis.  Echocardiogram noted preserved LVEF and aortic stenosis.  There is discordant findings with regards to the severity of aortic stenosis based on hemodynamics and planimetry.  Aortic valve area by planimetry is 0.94 cm and mean gradient is 13.5 mmHG and dimensional index 0.57.  Patient was supposed to have a repeat echo in December 2024 of her recent and the study was never performed.  Clinically she denies anginal chest pain or heart failure symptoms or near syncope or syncopal events.  She complains of bilateral lower extremity pain predominately after walking.  She is unable to localize it to particular muscle group.  Cannot rule out claudication.  Home blood pressure is also not well-controlled with SBP ranging between 144-155 mmHg.  Shortness of breath with effort related activity remains chronic and stable.  She uses torsemide  on as needed basis.  Review of Systems: .   Review of Systems  Cardiovascular:   Positive for claudication and dyspnea on exertion (chronic and stable). Negative for chest pain, irregular heartbeat, leg swelling, near-syncope, orthopnea, palpitations, paroxysmal nocturnal dyspnea and syncope.  Respiratory:  Positive for shortness of breath (chronic and stable).   Hematologic/Lymphatic: Negative for bleeding problem.    Studies Reviewed:   EKG: EKG Interpretation Date/Time:  Monday May 23 2023 14:30:51 EDT Ventricular Rate:  81 PR Interval:  200 QRS Duration:  88 QT Interval:  368 QTC Calculation: 427 R Axis:   19  Text Interpretation: Normal sinus rhythm Cannot rule out Anterior infarct , age undetermined When compared with ECG of 22-Jul-2014 13:41, No significant change since last tracing Confirmed by Olinda Bertrand [phone]) on 05/23/2023 2:36:30 PM  Echocardiogram: 06/16/2022 1. Left ventricular ejection fraction, by estimation, is 65 to 70%. The left ventricle has normal function. The left ventricle has no regional wall motion abnormalities. Left ventricular diastolic parameters were normal. The average left ventricular  global longitudinal strain is -24.0 %. 2. Right ventricular systolic function is normal. The right ventricular size is normal. There is mildly elevated pulmonary artery systolic pressure. 3. Right atrial size was mildly dilated. 4. The mitral valve is degenerative. Mild mitral valve regurgitation. No evidence of mitral stenosis. 5. Tricuspid valve regurgitation is moderate. 6. The aortic valve is tricuspid. Aortic valve regurgitation is trivial. Aortic valve sclerosis with degree of aortic valve stenosis (peak velocity 2.58ms/, MG 13.52mmHG, DI 0.57, valve planimetry 0.943cm2 (discordant to the other hemodynamic  parameters)). 7. The inferior vena cava is normal in size with greater than 50% respiratory variability, suggesting right atrial pressure of 3 mmHg. 8. Rhythm strip  during this exam demonstrates normal sinus rhythm   Stress  Testing: Regadenoson  (with Mod Bruce protocol) Nuclear stress test 08/09/2022: Myocardial perfusion is normal. Overall LV systolic function is normal without regional wall motion abnormalities. Stress LV EF: 78%.  Equivocal ECG stress. Resting EKG demonstrated normal sinus rhythm. Peak EKG revealed 1 mm horizontal ST depression of the inferolateral leads persisting for > 2 minutes into recovery. No chest pain. Patient achieved 88% MPHR with walking Regadenoson  stress. No chest pain.  The baseline blood pressure was 180/90 mmHg. Maximum blood pressure post injection was 220/90 mmHg, which is a physiologic response. No previous exam available for comparison. Intermediate risk due to abnormal EKG response. Clinical correlation recommended.   Carotid artery duplex 07/15/2022: Duplex suggests stenosis in the right internal carotid artery (minimal). Duplex suggests stenosis in the left internal carotid artery (minimal). Antegrade right vertebral artery flow. Antegrade left vertebral artery flow.   RADIOLOGY: NA  Risk Assessment/Calculations:   NA   Labs:       Latest Ref Rng & Units 07/22/2014    2:00 PM  CBC  WBC 4.0 - 10.5 K/uL 8.8   Hemoglobin 12.0 - 15.0 g/dL 03.4   Hematocrit 74.2 - 46.0 % 33.0   Platelets 150 - 400 K/uL 371        Latest Ref Rng & Units 09/07/2022   11:58 AM 08/09/2022    2:12 PM 07/22/2014    2:00 PM  BMP  Glucose 70 - 99 mg/dL 595  99  98   BUN 8 - 27 mg/dL 6  7  13    Creatinine 0.57 - 1.00 mg/dL 6.38  7.56  4.33   BUN/Creat Ratio 12 - 28 9  9     Sodium 134 - 144 mmol/L 128  129  127   Potassium 3.5 - 5.2 mmol/L 4.6  4.0  4.0   Chloride 96 - 106 mmol/L 91  89  90   CO2 20 - 29 mmol/L 24  26  31    Calcium 8.7 - 10.3 mg/dL 9.4  29.5  9.3       Latest Ref Rng & Units 09/07/2022   11:58 AM 08/09/2022    2:12 PM 07/22/2014    2:00 PM  CMP  Glucose 70 - 99 mg/dL 188  99  98   BUN 8 - 27 mg/dL 6  7  13    Creatinine 0.57 - 1.00 mg/dL 4.16  6.06  3.01   Sodium  134 - 144 mmol/L 128  129  127   Potassium 3.5 - 5.2 mmol/L 4.6  4.0  4.0   Chloride 96 - 106 mmol/L 91  89  90   CO2 20 - 29 mmol/L 24  26  31    Calcium 8.7 - 10.3 mg/dL 9.4  60.1  9.3     No results found for: "CHOL", "HDL", "LDLCALC", "LDLDIRECT", "TRIG", "CHOLHDL" No results for input(s): "LIPOA" in the last 8760 hours. No components found for: "NTPROBNP" No results for input(s): "PROBNP" in the last 8760 hours. No results for input(s): "TSH" in the last 8760 hours.  Physical Exam:    Today's Vitals   05/23/23 1434  BP: (!) 140/80  Pulse: 79  Resp: 16  SpO2: 93%  Weight: 154 lb 3.2 oz (69.9 kg)  Height: 5\' 4"  (1.626 m)   Body mass index is 26.47 kg/m. Wt Readings from Last 3 Encounters:  05/23/23 154 lb 3.2 oz (69.9 kg)  10/15/22 152 lb (68.9 kg)  07/15/22  161 lb (73 kg)    Physical Exam  Constitutional: No distress.  Age appropriate, hemodynamically stable.   Neck: No JVD present.  Cardiovascular: Normal rate, regular rhythm, S1 normal, S2 normal and intact distal pulses. Exam reveals no gallop, no S3 and no S4.  Murmur heard. Harsh crescendo-decrescendo systolic murmur is present with a grade of 3/6 at the upper right sternal border radiating to the neck. Pulses:      Carotid pulses are  on the right side with bruit and  on the left side with bruit.      Dorsalis pedis pulses are 1+ on the right side and 1+ on the left side.       Posterior tibial pulses are 1+ on the right side and 1+ on the left side.  Pulmonary/Chest: Effort normal. No stridor. She has no rales. She has diffuse wheezes.  Abdominal: Soft. Bowel sounds are normal. She exhibits no distension. There is no abdominal tenderness.  Musculoskeletal:        General: Edema present.     Cervical back: Neck supple.  Neurological: She is alert and oriented to person, place, and time. She has intact cranial nerves (2-12).  Skin: Skin is warm and moist.     Impression & Recommendation(s):  Impression:    ICD-10-CM   1. Nonrheumatic aortic valve stenosis  I35.0 EKG 12-Lead    ECHOCARDIOGRAM COMPLETE    2. Claudication (HCC)  I73.9 VAS US  ABI WITH/WO TBI    3. Benign hypertension  I10 carvedilol  (COREG ) 25 MG tablet    4. Precordial chest pain  R07.2 carvedilol  (COREG ) 25 MG tablet       Recommendation(s):  Nonrheumatic aortic valve stenosis Denies a 3 cardinal symptoms of anginal chest pain, heart failure symptoms, syncope. Prior echocardiogram in May 2024 noted discrepancy between aortic valve area by planimetry versus hemodynamics Shared decision was to repeat echocardiogram in 6 months in December 2024 but this never occurred. Patient will be scheduled for repeat echocardiogram in May 2025 to reevaluate disease progression in LVEF.  Claudication [facility]) Suspect that she has either claudication or pseudoclaudication. Patient is not the best historian. Diminished bilateral lower extremity pulses when compared to prior physical examination findings. Will proceed forward with ankle-brachial index bilaterally to screen for PAD. If ankle-brachial indexes are abnormal we will need to proceed with lower extremity arterial duplex. Patient denies any poor wound healing. No signs of clinical limb ischemia  Benign hypertension Office blood pressures are not well-controlled.  Home blood pressures are also not well-controlled. She brings in a few readings from her home and SBP ranges between 144-125 mmHg. Currently on carvedilol  12.5 mg p.o. twice daily will increase it to 25 mg p.o. twice daily. Reemphasized importance of low-salt diet. Monitor blood pressures  Orders Placed:  Orders Placed This Encounter  Procedures   EKG 12-Lead   ECHOCARDIOGRAM COMPLETE    Standing Status:   Future    Expected Date:   06/02/2023    Expiration Date:   05/22/2024    Where should this test be performed:   [facility])    Does the patient weigh less than or greater than 250 lbs?:    Patient weighs less than 250 lbs    Perflutren DEFINITY (image enhancing agent) should be administered unless hypersensitivity or allergy exist:   Administer Perflutren    Reason for exam-Echo:   Other-Full Diagnosis List    Full ICD-10/Reason for Exam:   Aortic stenosis [161096]  Final Medication List:    Meds ordered this encounter  Medications   carvedilol  (COREG ) 25 MG tablet    Sig: Take 1 tablet (25 mg total) by mouth 2 (two) times daily with a meal. HOLD if systolic blood pressure (top number) is less than 100 and/ or heart rate is less than 55.    Dispense:  180 tablet    Refill:  3    Increased to 25 mg BID    Medications Discontinued During This Encounter  Medication Reason   losartan (COZAAR) 100 MG tablet Change in therapy   torsemide  (DEMADEX ) 10 MG tablet Change in therapy   carvedilol  (COREG ) 12.5 MG tablet      Current Outpatient Medications:    albuterol (VENTOLIN HFA) 108 (90 Base) MCG/ACT inhaler, Inhale into the lungs every 6 (six) hours as needed for wheezing or shortness of breath., Disp: , Rfl:    alendronate (FOSAMAX) 70 MG tablet, Take 70 mg by mouth once a week., Disp: , Rfl: 5   aspirin EC 81 MG tablet, Take 81 mg by mouth daily., Disp: , Rfl:    atorvastatin (LIPITOR) 10 MG tablet, Take 10 mg by mouth daily., Disp: , Rfl:    Calcium Carbonate-Vitamin D (CALTRATE 600+D PO), Take 1 tablet by mouth 2 (two) times a week., Disp: , Rfl:    fluticasone (FLONASE) 50 MCG/ACT nasal spray, Place 1 spray into both nostrils daily., Disp: , Rfl:    losartan-hydrochlorothiazide (HYZAAR) 100-12.5 MG tablet, Take 1 tablet by mouth daily., Disp: , Rfl:    montelukast (SINGULAIR) 10 MG tablet, Take 10 mg by mouth daily., Disp: , Rfl: 5   multivitamin-iron-minerals-folic acid (CENTRUM) chewable tablet, Chew 1 tablet by mouth every other day., Disp: , Rfl:    sertraline (ZOLOFT) 50 MG tablet, Take 50 mg by mouth daily., Disp: , Rfl:    torsemide  (DEMADEX ) 10 MG tablet,  Take 10 mg by mouth daily as needed (If you gain 1 pound over 24hr or 3 pounds over a week.)., Disp: , Rfl:    TRAVATAN Z 0.004 % SOLN ophthalmic solution, Place 1 drop into both eyes at bedtime., Disp: , Rfl: 12   carvedilol  (COREG ) 25 MG tablet, Take 1 tablet (25 mg total) by mouth 2 (two) times daily with a meal. HOLD if systolic blood pressure (top number) is less than 100 and/ or heart rate is less than 55., Disp: 180 tablet, Rfl: 3  Consent:   NA  Disposition:   29-month follow-up sooner if needed  Her questions and concerns were addressed to her satisfaction. She voices understanding of the recommendations provided during this encounter.    Signed, Olinda Bertrand, DO, [facility]  [facility]  [address] [place], [place] [address] 05/23/2023 3:02 PM

## 2023-05-24 ENCOUNTER — Telehealth: Payer: Self-pay | Admitting: Cardiology

## 2023-05-24 NOTE — Telephone Encounter (Signed)
 Received verbal consent from pt to speak with her daughter about her medical care. Daughter wanted clarification of her mother's blood pressure medication. It was explained to the daughter that her mother should be taking Carvedilol  25 mg twice daily with meals and should only hold the medication if she takes her blood pressure and the systolic bp is less than 100 and or her pulse is 55 or less. Daughter verbalized understanding. All questions if any were answered.

## 2023-05-24 NOTE — Telephone Encounter (Signed)
 Pts daughter would like a c/b in regards to what was discussed at yesterdays appt about managing the pts BP. Please advise

## 2023-05-24 NOTE — Telephone Encounter (Signed)
 Pts daughter states she might step out so call her cell number instead

## 2023-06-06 DIAGNOSIS — J209 Acute bronchitis, unspecified: Secondary | ICD-10-CM | POA: Diagnosis not present

## 2023-06-06 DIAGNOSIS — R051 Acute cough: Secondary | ICD-10-CM | POA: Diagnosis not present

## 2023-06-06 DIAGNOSIS — S2242XA Multiple fractures of ribs, left side, initial encounter for closed fracture: Secondary | ICD-10-CM | POA: Diagnosis not present

## 2023-06-07 ENCOUNTER — Telehealth: Payer: Self-pay | Admitting: Cardiology

## 2023-06-07 NOTE — Telephone Encounter (Signed)
 Daughter says patient was recently seen at urgent care and heart was enlarged, and she was advised to follow up with Dr. Albert Huff.

## 2023-06-07 NOTE — Telephone Encounter (Signed)
 Spoke with daughter and she states patient fell and broke her ribs. Xray was done and they informed her that patient's heart was enlarged. She will need to follow up with cardiologist.  She would like to know do they need follow up appointment or can they wait until after the echo?

## 2023-06-08 NOTE — Telephone Encounter (Signed)
 Spoke with daughter and explained Dr. Jesus Morones recommendations. LMTCB on pt's daughter's phone per pt request.

## 2023-06-08 NOTE — Telephone Encounter (Signed)
 If she has chest pain, shortness of breath, pleuritic discomfort, hypotension she should go to the ER for further evaluation and management.  Also have them bring in the copy of the imaging testing that mentioned " heart was enlarged."  Otherwise I will see her as originally planned-sooner if needed.  Isabel Sample Blanca, DO, [facility]

## 2023-06-16 NOTE — Telephone Encounter (Signed)
 Addendum from prior note: **spoke with pt. LMTCB on pt's daughter's phone per pt request.

## 2023-06-23 ENCOUNTER — Ambulatory Visit (HOSPITAL_BASED_OUTPATIENT_CLINIC_OR_DEPARTMENT_OTHER)
Admission: RE | Admit: 2023-06-23 | Discharge: 2023-06-23 | Disposition: A | Discharge status: Home / Self Care | Source: Ambulatory Visit | Attending: Cardiology | Admitting: Cardiology

## 2023-06-23 ENCOUNTER — Ambulatory Visit (HOSPITAL_COMMUNITY)
Admission: RE | Admit: 2023-06-23 | Discharge: 2023-06-23 | Disposition: A | Discharge status: Home / Self Care | Source: Ambulatory Visit | Attending: Cardiology | Admitting: Cardiology

## 2023-06-23 DIAGNOSIS — I739 Peripheral vascular disease, unspecified: Secondary | ICD-10-CM | POA: Insufficient documentation

## 2023-06-23 DIAGNOSIS — I350 Nonrheumatic aortic (valve) stenosis: Secondary | ICD-10-CM | POA: Diagnosis not present

## 2023-06-23 LAB — VAS US ABI WITH/WO TBI
Left ABI: 1.24
Right ABI: 1.17

## 2023-06-24 LAB — ECHOCARDIOGRAM COMPLETE
AR max vel: 1.07 cm2
AV Area VTI: 1.21 cm2
AV Area mean vel: 1.3 cm2
AV Mean grad: 20 mmHg
AV Peak grad: 41.7 mmHg
Ao pk vel: 3.23 m/s
Area-P 1/2: 3.42 cm2
S' Lateral: 2.7 cm

## 2023-06-27 ENCOUNTER — Ambulatory Visit: Payer: Self-pay | Admitting: Cardiology

## 2023-06-27 DIAGNOSIS — I350 Nonrheumatic aortic (valve) stenosis: Secondary | ICD-10-CM

## 2023-06-29 DIAGNOSIS — R0602 Shortness of breath: Secondary | ICD-10-CM | POA: Diagnosis not present

## 2023-06-29 DIAGNOSIS — R079 Chest pain, unspecified: Secondary | ICD-10-CM | POA: Diagnosis not present

## 2023-06-29 DIAGNOSIS — S2242XA Multiple fractures of ribs, left side, initial encounter for closed fracture: Secondary | ICD-10-CM | POA: Diagnosis not present

## 2023-06-29 DIAGNOSIS — S2249XK Multiple fractures of ribs, unspecified side, subsequent encounter for fracture with nonunion: Secondary | ICD-10-CM | POA: Diagnosis not present

## 2023-06-29 DIAGNOSIS — W228XXA Striking against or struck by other objects, initial encounter: Secondary | ICD-10-CM | POA: Diagnosis not present

## 2023-06-29 DIAGNOSIS — S2232XA Fracture of one rib, left side, initial encounter for closed fracture: Secondary | ICD-10-CM | POA: Diagnosis not present

## 2023-06-29 DIAGNOSIS — I10 Essential (primary) hypertension: Secondary | ICD-10-CM | POA: Diagnosis not present

## 2023-09-01 DIAGNOSIS — I10 Essential (primary) hypertension: Secondary | ICD-10-CM | POA: Diagnosis not present

## 2023-09-01 DIAGNOSIS — J4540 Moderate persistent asthma, uncomplicated: Secondary | ICD-10-CM | POA: Diagnosis not present

## 2023-09-01 DIAGNOSIS — I350 Nonrheumatic aortic (valve) stenosis: Secondary | ICD-10-CM | POA: Diagnosis not present

## 2023-09-05 ENCOUNTER — Telehealth: Payer: Self-pay | Admitting: Cardiology

## 2023-09-05 DIAGNOSIS — R6883 Chills (without fever): Secondary | ICD-10-CM | POA: Diagnosis not present

## 2023-09-05 DIAGNOSIS — R634 Abnormal weight loss: Secondary | ICD-10-CM | POA: Diagnosis not present

## 2023-09-05 DIAGNOSIS — I10 Essential (primary) hypertension: Secondary | ICD-10-CM

## 2023-09-05 DIAGNOSIS — E782 Mixed hyperlipidemia: Secondary | ICD-10-CM | POA: Diagnosis not present

## 2023-09-05 NOTE — Telephone Encounter (Signed)
 Spoke with the patient and her daughter who report that the patient's blood pressure at her PCP this morning was 208/81. She states that the patient has not been feeling good for the past week - with nausea and dizziness. She states that her blood pressures at home have been fine with the highest reading of 168/?. Her PCP also listened to her lungs and noted some possible fluid. Her PCP recommended taking torsemide  today and tomorrow and contacting us  for BP recommendations. Labs were also drawn by PCP today, results pending. Patient took her blood pressure while on the phone and it was 144/74. Patient denies any increase in salt intake. Denies shortness of breath, swelling, or chest pain. Advised patient to take recommended diuretic from PCP, keep a log of blood pressures at home. Will send message to Dr. Michele for further recommendations.

## 2023-09-05 NOTE — Telephone Encounter (Signed)
 Pt c/o BP issue: STAT if pt c/o blurred vision, one-sided weakness or slurred speech.  STAT if BP is GREATER than 180/120 TODAY.  STAT if BP is LESS than 90/60 and SYMPTOMATIC TODAY  1. What is your BP concern? BP elevated  2. Have you taken any BP medication today? Yes  3. What are your last 5 BP readings? 201/81 - Today at PCP 188/81   4. Are you having any other symptoms (ex. Dizziness, headache, blurred vision, passed out)? Dizziness, headache  Pt's PCP suggested that she takes fluid pill to help lower BP

## 2023-09-06 NOTE — Telephone Encounter (Signed)
 Isabel Preston,   If her blood pressures are around 140 mmHg she can continue the same medications for now. May benefit from Pharm.D. referral for blood pressure management.  However if she continues to have headaches/dizziness or any focal neurological deficits with such a high blood pressure (208/81) she needs to go to the hospital to rule out stroke.  Dr. Anarosa Kubisiak

## 2023-09-08 NOTE — Telephone Encounter (Signed)
 Patient's daughter reports that blood pressures have remained 140-150 systolic. She states it has not gone back above 200 since being at her PCP. Patient's daughter recently passed away so she has been under a lot of stress. Referral placed to PharmD.

## 2023-09-22 ENCOUNTER — Other Ambulatory Visit: Payer: Self-pay | Admitting: Cardiology

## 2023-10-02 DIAGNOSIS — I10 Essential (primary) hypertension: Secondary | ICD-10-CM | POA: Diagnosis not present

## 2023-10-02 DIAGNOSIS — I350 Nonrheumatic aortic (valve) stenosis: Secondary | ICD-10-CM | POA: Diagnosis not present

## 2023-10-02 DIAGNOSIS — J4540 Moderate persistent asthma, uncomplicated: Secondary | ICD-10-CM | POA: Diagnosis not present

## 2023-10-10 DIAGNOSIS — H401132 Primary open-angle glaucoma, bilateral, moderate stage: Secondary | ICD-10-CM | POA: Diagnosis not present

## 2023-11-01 DIAGNOSIS — J4540 Moderate persistent asthma, uncomplicated: Secondary | ICD-10-CM | POA: Diagnosis not present

## 2023-11-01 DIAGNOSIS — I10 Essential (primary) hypertension: Secondary | ICD-10-CM | POA: Diagnosis not present

## 2023-11-01 DIAGNOSIS — I350 Nonrheumatic aortic (valve) stenosis: Secondary | ICD-10-CM | POA: Diagnosis not present

## 2023-11-07 ENCOUNTER — Telehealth: Payer: Self-pay | Admitting: Cardiology

## 2023-11-07 DIAGNOSIS — I10 Essential (primary) hypertension: Secondary | ICD-10-CM

## 2023-11-07 DIAGNOSIS — I350 Nonrheumatic aortic (valve) stenosis: Secondary | ICD-10-CM

## 2023-11-07 MED ORDER — CARVEDILOL 25 MG PO TABS: 25.0000 mg | ORAL_TABLET | Freq: Two times a day (BID) | ORAL | 1 refills | Status: AC

## 2023-11-07 NOTE — Telephone Encounter (Signed)
 RX sent in

## 2023-11-07 NOTE — Telephone Encounter (Signed)
*  STAT* If patient is at the pharmacy, call can be transferred to refill team.   1. Which medications need to be refilled? (please list name of each medication and dose if known) carvedilol  (COREG ) 25 MG tablet   2. Which pharmacy/location (including street and city if local pharmacy) is medication to be sent to? [id number] - [place], [place] - [address]  3. Do they need a 30 day or 90 day supply? 90 day supply  Patient says she has been completely out of medication for 2 days.

## 2024-01-19 IMAGING — MG MM DIGITAL SCREENING BILAT W/ TOMO AND CAD
8 series · 8 of 24 positions shown · non-contrast
Comparison: Previous exam(s).

CLINICAL DATA: Screening.

EXAM:
DIGITAL SCREENING BILATERAL MAMMOGRAM WITH TOMOSYNTHESIS AND CAD
TECHNIQUE: Bilateral screening digital craniocaudal and mediolateral oblique
mammograms were obtained. Bilateral screening digital breast
tomosynthesis was performed. The images were evaluated with
computer-aided detection.

[L MLO synth-2D]
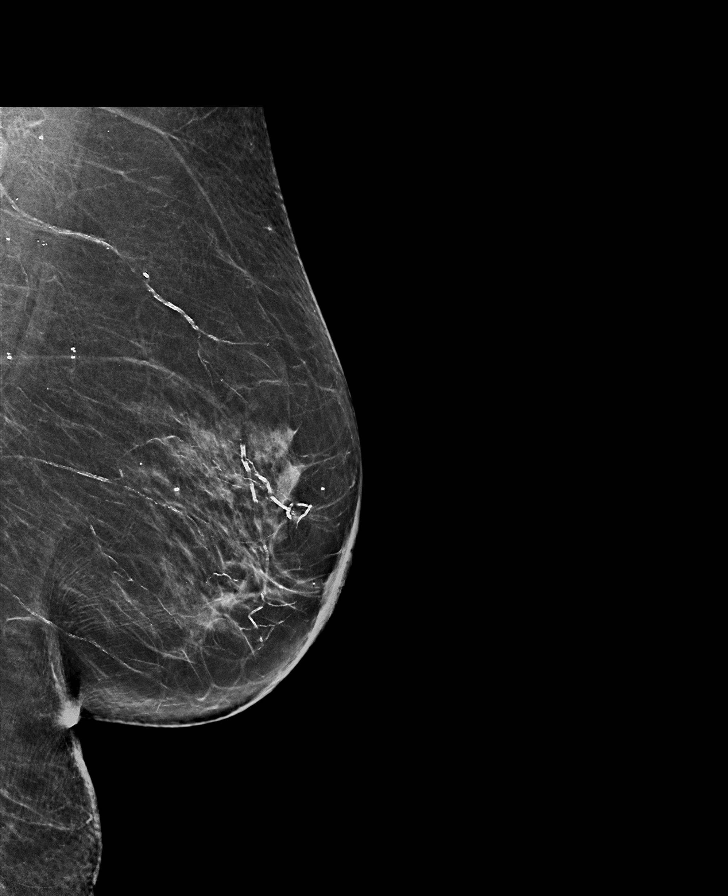

[R CC synth-2D]
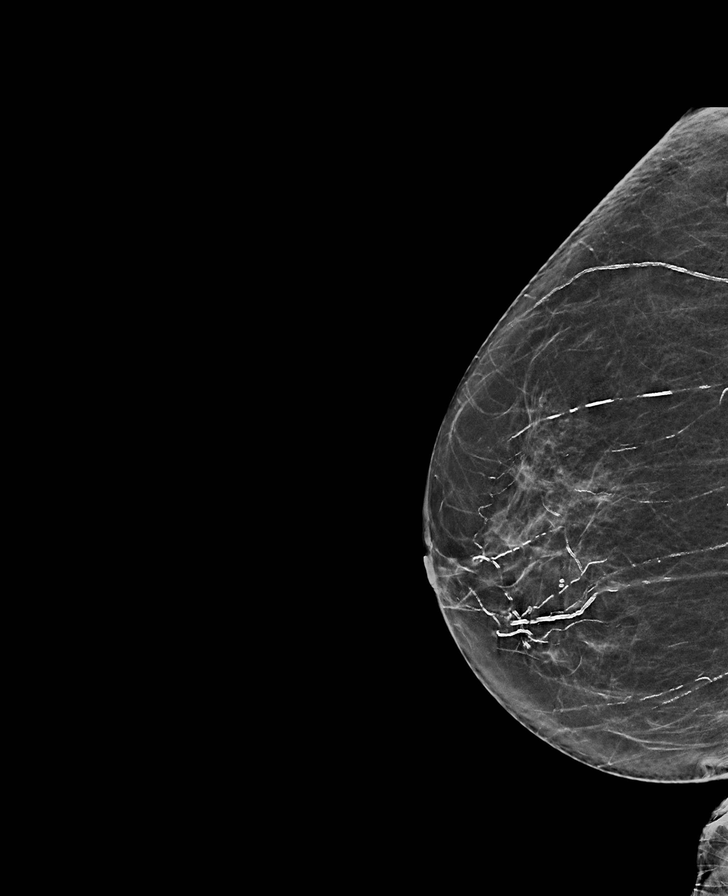

[L CC synth-2D]
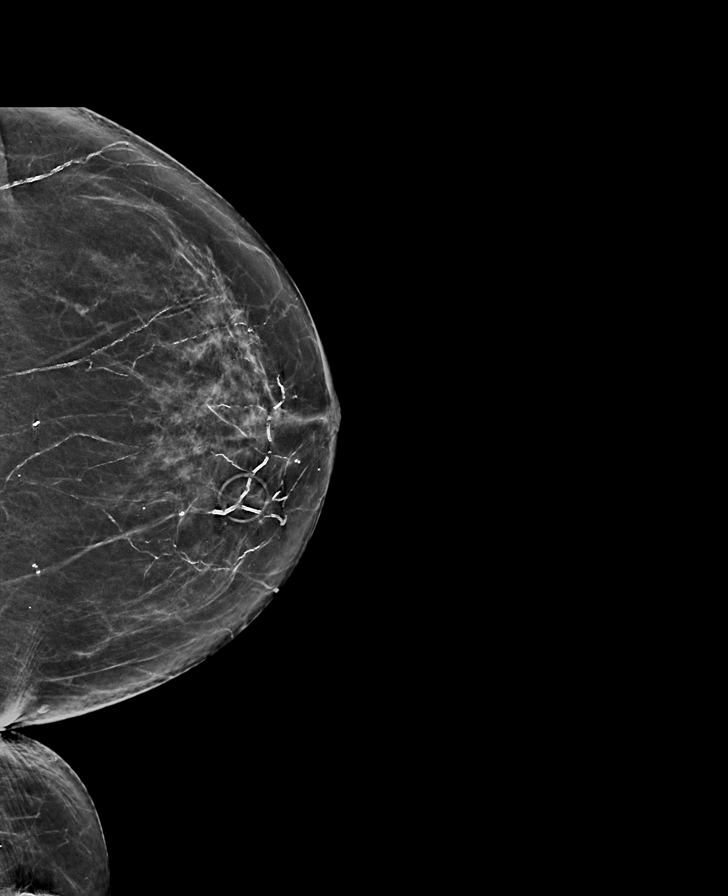

[R MLO synth-2D]
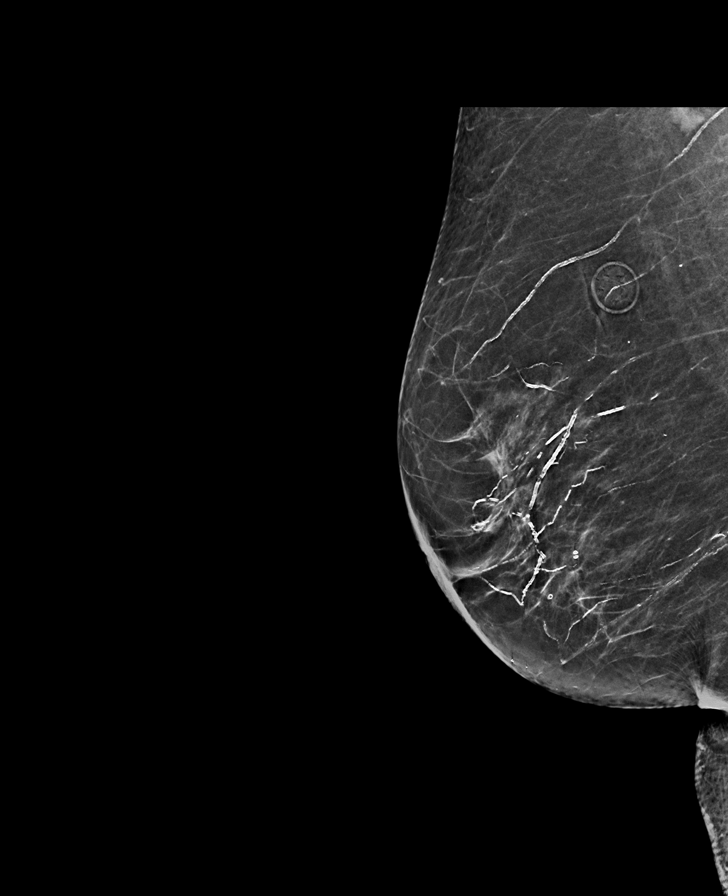

[L MLO tomo · tomo slice 36/71.0]
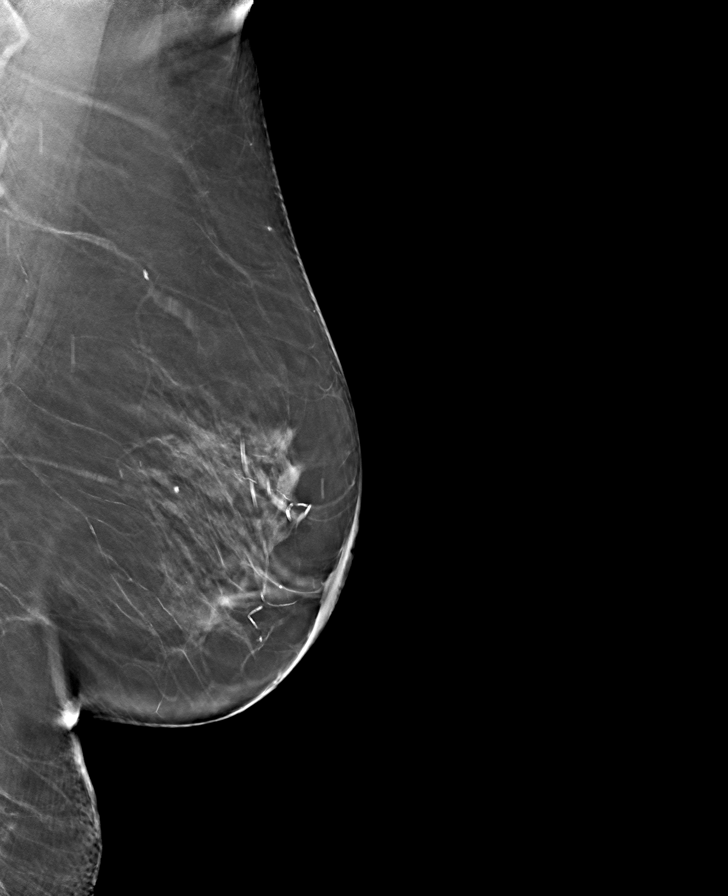

[L CC tomo · tomo slice 31/61.0]
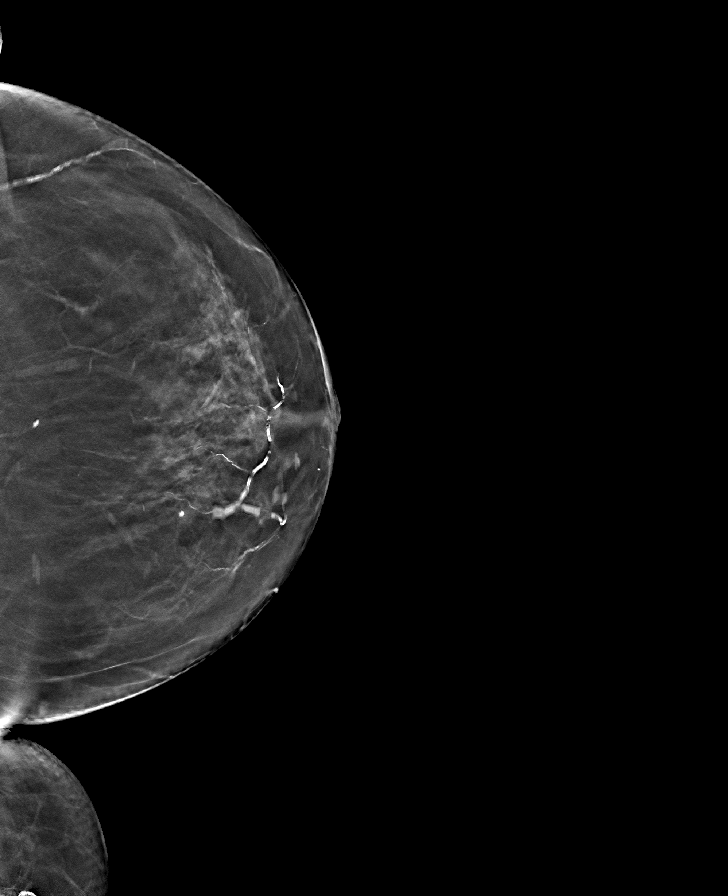

[R CC tomo · tomo slice 29/58.0]
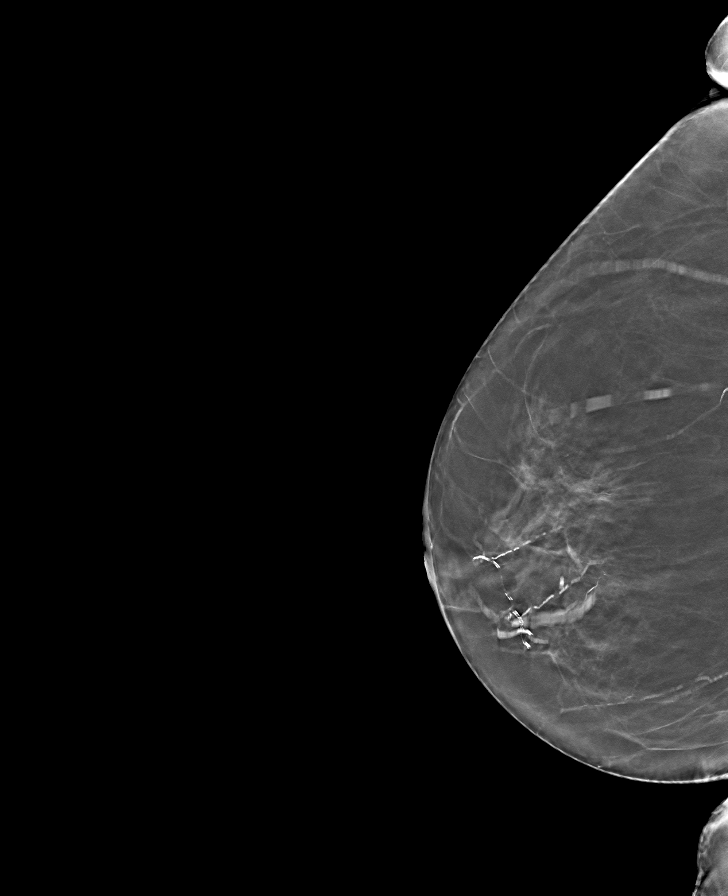

[R MLO tomo · tomo slice 31/61.0]
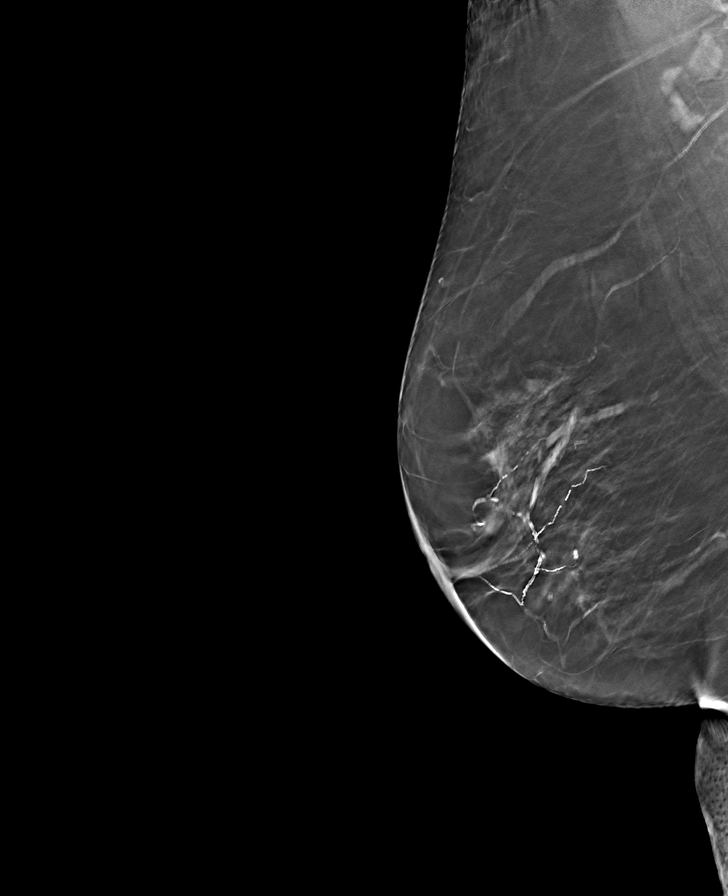

[8 of 24 positions shown; findings below may reference images not displayed]

ACR Breast Density Category b: There are scattered areas of
fibroglandular density.
FINDINGS: There are no findings suspicious for malignancy.
IMPRESSION: No mammographic evidence of malignancy. A result letter of this
screening mammogram will be mailed directly to the patient.

RECOMMENDATION:
Screening mammogram in one year. (Code:51-O-LD2)

BI-RADS CATEGORY  1: Negative.
